# Patient Record
Sex: Male | Born: 1960 | Race: Black or African American | Hispanic: No | Marital: Married | Smoking: Never smoker
Health system: Southern US, Community
[De-identification: ages and names within clinical notes are randomized; demographics above are authoritative.]

## PROBLEM LIST (undated history)

## (undated) DIAGNOSIS — I10 Essential (primary) hypertension: Secondary | ICD-10-CM

## (undated) HISTORY — DX: Essential (primary) hypertension: I10

---

## 2009-04-12 ENCOUNTER — Ambulatory Visit: Payer: Self-pay | Admitting: Family Medicine

## 2009-04-12 DIAGNOSIS — I10 Essential (primary) hypertension: Secondary | ICD-10-CM

## 2009-05-04 ENCOUNTER — Ambulatory Visit: Payer: Self-pay | Admitting: Family Medicine

## 2009-05-07 LAB — CONVERTED CEMR LAB
AST: 24 units/L (ref 0–37)
Albumin: 4.4 g/dL (ref 3.5–5.2)
BUN: 16 mg/dL (ref 6–23)
Calcium: 9.6 mg/dL (ref 8.4–10.5)
Chloride: 106 meq/L (ref 96–112)
Creatinine, Ser: 1.12 mg/dL (ref 0.40–1.50)
Glucose, Bld: 106 mg/dL — ABNORMAL HIGH (ref 70–99)
Potassium: 4 meq/L (ref 3.5–5.3)

## 2009-05-18 ENCOUNTER — Ambulatory Visit: Payer: Self-pay | Admitting: Family Medicine

## 2009-06-07 ENCOUNTER — Ambulatory Visit: Payer: Self-pay | Admitting: Family Medicine

## 2009-07-03 ENCOUNTER — Ambulatory Visit: Payer: Self-pay | Admitting: Family Medicine

## 2011-03-04 ENCOUNTER — Other Ambulatory Visit: Payer: Self-pay | Admitting: Family Medicine

## 2011-03-04 MED ORDER — CARVEDILOL 25 MG PO TABS: 25.0000 mg | ORAL_TABLET | Freq: Two times a day (BID) | ORAL | Status: DC

## 2011-03-04 MED ORDER — OLMESARTAN-AMLODIPINE-HCTZ 40-10-25 MG PO TABS: ORAL_TABLET | ORAL | Status: DC

## 2011-03-04 NOTE — Telephone Encounter (Signed)
Refill of BP med.  Out X 2 weeks.  Refilled carvedilol and tribenzor. Pt was scheduled an office visit for his BP as well for next week.  Pt informed that he needs to keep this appt before any more refills will be sent. Jarvis Newcomer, LPN [facility]

## 2011-03-08 ENCOUNTER — Encounter: Payer: Self-pay | Admitting: Family Medicine

## 2011-03-13 ENCOUNTER — Ambulatory Visit (INDEPENDENT_AMBULATORY_CARE_PROVIDER_SITE_OTHER): Payer: Commercial Indemnity | Admitting: Family Medicine

## 2011-03-13 ENCOUNTER — Encounter: Payer: Self-pay | Admitting: Family Medicine

## 2011-03-13 VITALS — BP 208/121 | HR 78 | Ht 72.0 in | Wt 251.0 lb

## 2011-03-13 DIAGNOSIS — I10 Essential (primary) hypertension: Secondary | ICD-10-CM

## 2011-03-13 MED ORDER — TRAMADOL HCL 50 MG PO TABS: 50.0000 mg | ORAL_TABLET | Freq: Three times a day (TID) | ORAL | Status: DC | PRN

## 2011-03-13 MED ORDER — OLMESARTAN-AMLODIPINE-HCTZ 40-10-25 MG PO TABS: ORAL_TABLET | ORAL | Status: DC

## 2011-03-13 NOTE — Assessment & Plan Note (Signed)
His blood pressure is clearly out of control since he is off of his medications. I gave him a month's worth of samples of Tribenzor were given to restart. He is to followup in one month to see how his blood pressure is doing. He will likely need an additional agent but I will drop him too fast to make him feel dizzy or lightheaded. He is completely asymptomatic today with no chest pain shortness of breath vision changes or dizziness. I suspect he's been running at a very elevated level for quite some time. We will get lab work today since he is fasting lipids CMP, lipid panel, TSH. He says he will follow up in one month.

## 2011-03-13 NOTE — Progress Notes (Signed)
  Subjective:    Patient ID: Philip Shelton, male    DOB: [birth year], 50 y.o.   MRN: [id number]  HPI  Off his BP meds for almost a year. Work BP have been running in the 160. Still on the carvedilol. No HA or CP, or SOB.  No vision changes.  Tribenzor, Med was not refilled for lack of followup.   Left shoulder pain for 2 weeks after wresting with his kids. He points to the top of his shoulder. Now painful to reach up. No weakness, painful to sleep on that shoulder. No throbing just a "pain".  Bothersome with activities. Better at rest. Not taking nay meds for it. No prior injuries to his shoulder. No weakness numbness or tingling in the hands.  Review of Systems     Objective:   Physical Exam  Constitutional: He is oriented to person, place, and time. He appears well-developed and well-nourished.  HENT:  Head: Normocephalic and atraumatic.  Cardiovascular: Normal rate, regular rhythm and normal heart sounds.   Pulmonary/Chest: Effort normal and breath sounds normal.  Musculoskeletal: He exhibits no edema.       Left shoulder: Unable to full extend, slightly dec internal rotaiton.  Strength 5/5 but + empty can test on the left.  Nontender with palpation. N oredness or swelling.   Neurological: He is alert and oriented to person, place, and time.  Skin: Skin is warm and dry.  Psychiatric: He has a normal mood and affect.          Assessment & Plan:  Left shoulder pain-based on his exam today do think he has a rotator cuff tear. I gave him a handout on this particular condition and recommend he start the exercises. I also recommend warm moist heat to the joint. Normally a recommend anti-inflammatory that his blood pressure is out of control today that I would recommend he start with Tylenol. I did give him a prescription for tramadol as well for when necessary pain use. Followup in one month to make sure he is improving. If not improving, we will consider putting him in formal physical  therapy at that point.

## 2011-03-13 NOTE — Patient Instructions (Signed)
Can also use Tylenol  (2 extra strengths)  3 x a day for your shoulder in addition to the tramadol.

## 2011-03-14 LAB — TSH: TSH: 1.462 u[IU]/mL (ref 0.350–4.500)

## 2011-03-14 LAB — LIPID PANEL
LDL Cholesterol: 77 mg/dL (ref 0–99)
Triglycerides: 91 mg/dL (ref <150)
VLDL: 18 mg/dL (ref 0–40)

## 2011-03-15 LAB — COMPLETE METABOLIC PANEL WITH GFR
ALT: 32 U/L (ref 0–53)
AST: 27 U/L (ref 0–37)
Albumin: 4.3 g/dL (ref 3.5–5.2)
CO2: 27 mEq/L (ref 19–32)
Calcium: 9.2 mg/dL (ref 8.4–10.5)
Chloride: 104 mEq/L (ref 96–112)
GFR, Est African American: >60 mL/min (ref >60)
Potassium: 4.1 mEq/L (ref 3.5–5.3)
Sodium: 140 mEq/L (ref 135–145)
Total Protein: 7.5 g/dL (ref 6.0–8.3)

## 2011-03-16 ENCOUNTER — Telehealth: Payer: Self-pay | Admitting: Family Medicine

## 2011-03-16 NOTE — Telephone Encounter (Signed)
Call ptL Thyroid is nl.  CMP is normal. Chol is OK excpet good chol is low. Recommend regular exercise to improve this.

## 2011-03-17 NOTE — Telephone Encounter (Signed)
LMOM advising pt of results and rec. 

## 2011-04-09 ENCOUNTER — Other Ambulatory Visit: Payer: Self-pay | Admitting: Family Medicine

## 2011-04-21 ENCOUNTER — Ambulatory Visit (INDEPENDENT_AMBULATORY_CARE_PROVIDER_SITE_OTHER): Payer: Commercial Indemnity | Admitting: Family Medicine

## 2011-04-21 ENCOUNTER — Encounter: Payer: Self-pay | Admitting: Family Medicine

## 2011-04-21 VITALS — BP 119/75 | HR 67 | Ht 72.0 in | Wt 244.0 lb

## 2011-04-21 DIAGNOSIS — I10 Essential (primary) hypertension: Secondary | ICD-10-CM

## 2011-04-21 MED ORDER — IBUPROFEN 800 MG PO TABS: 800.0000 mg | ORAL_TABLET | Freq: Three times a day (TID) | ORAL | Status: AC | PRN

## 2011-04-21 NOTE — Progress Notes (Signed)
  Subjective:    Patient ID: Philip Shelton, male    DOB: [birth year], 50 y.o.   MRN: [id number]  Hypertension This is a chronic problem. The current episode started more than 1 year ago. The problem is controlled. There are no associated agents to hypertension. Risk factors for coronary artery disease include no known risk factors. Past treatments include ACE inhibitors, lifestyle changes, calcium channel blockers and beta blockers. The current treatment provides significant improvement. There are no compliance problems.       Review of Systems     Objective:   Physical Exam  Constitutional: He is oriented to person, place, and time. He appears well-developed and well-nourished.  HENT:  Head: Normocephalic and atraumatic.  Cardiovascular: Normal rate, regular rhythm and normal heart sounds.   Pulmonary/Chest: Effort normal and breath sounds normal.  Musculoskeletal: He exhibits no edema.  Neurological: He is alert and oriented to person, place, and time.  Skin: Skin is warm and dry.  Psychiatric: He has a normal mood and affect.          Assessment & Plan:

## 2011-04-21 NOTE — Assessment & Plan Note (Signed)
He is ding well. Has lost 7 lbs, eating more healthy diet, low salt, getting exercise. BP well controlled for the first time in year. Keep up the goodwork.  F/U in 6 months.

## 2011-05-14 ENCOUNTER — Other Ambulatory Visit: Payer: Self-pay | Admitting: Family Medicine

## 2011-06-16 ENCOUNTER — Other Ambulatory Visit: Payer: Self-pay | Admitting: Family Medicine

## 2011-09-24 ENCOUNTER — Other Ambulatory Visit: Payer: Self-pay | Admitting: Family Medicine

## 2011-11-27 ENCOUNTER — Other Ambulatory Visit: Payer: Self-pay | Admitting: Family Medicine

## 2011-11-28 NOTE — Telephone Encounter (Signed)
Needs appointment

## 2012-01-09 ENCOUNTER — Other Ambulatory Visit: Payer: Self-pay | Admitting: Family Medicine

## 2012-01-09 NOTE — Telephone Encounter (Signed)
Must make appoinment before any further refills

## 2012-03-05 ENCOUNTER — Other Ambulatory Visit: Payer: Self-pay | Admitting: Family Medicine

## 2012-03-14 ENCOUNTER — Other Ambulatory Visit: Payer: Self-pay | Admitting: Family Medicine

## 2012-05-26 ENCOUNTER — Encounter: Payer: Self-pay | Admitting: Family Medicine

## 2012-05-26 ENCOUNTER — Ambulatory Visit (INDEPENDENT_AMBULATORY_CARE_PROVIDER_SITE_OTHER): Payer: BC Managed Care – PPO | Admitting: Family Medicine

## 2012-05-26 VITALS — BP 178/109 | HR 75 | Wt 249.0 lb

## 2012-05-26 DIAGNOSIS — Z1211 Encounter for screening for malignant neoplasm of colon: Secondary | ICD-10-CM

## 2012-05-26 DIAGNOSIS — D239 Other benign neoplasm of skin, unspecified: Secondary | ICD-10-CM

## 2012-05-26 DIAGNOSIS — I10 Essential (primary) hypertension: Secondary | ICD-10-CM

## 2012-05-26 LAB — LIPID PANEL
Cholesterol: 129 mg/dL (ref 0–200)
HDL: 38 mg/dL — ABNORMAL LOW (ref >39)
LDL Cholesterol: 74 mg/dL (ref 0–99)
Triglycerides: 86 mg/dL (ref <150)

## 2012-05-26 LAB — COMPLETE METABOLIC PANEL WITH GFR
ALT: 30 U/L (ref 0–53)
AST: 21 U/L (ref 0–37)
CO2: 26 mEq/L (ref 19–32)
Creat: 1 mg/dL (ref 0.50–1.35)
GFR, Est African American: >89 mL/min
Total Bilirubin: 0.4 mg/dL (ref 0.3–1.2)

## 2012-05-26 MED ORDER — CARVEDILOL 25 MG PO TABS: 25.0000 mg | ORAL_TABLET | Freq: Two times a day (BID) | ORAL | Status: DC

## 2012-05-26 MED ORDER — OLMESARTAN-AMLODIPINE-HCTZ 40-10-25 MG PO TABS: 1.0000 | ORAL_TABLET | Freq: Every day | ORAL | Status: DC

## 2012-05-26 NOTE — Progress Notes (Signed)
  Subjective:    Patient ID: Philip Shelton, male    DOB: [birth year], 51 y.o.   MRN: [id number]  HPI HTN- No CP or SOB or HA or dizziness.  Tooks meds this AM.  No NSAIDs or decongestant. He denies eating a high salt diet. He has gained 5 pounds since I saw him a year ago. He denies any lower Schimke swelling or edema.  Has lesion on right upper arm. Noticed i tmonths ago. Not bothersome. Has actually been getting smaller.    Review of Systems     Objective:   Physical Exam  Constitutional: He is oriented to person, place, and time. He appears well-developed and well-nourished.  HENT:  Head: Normocephalic and atraumatic.  Cardiovascular: Normal rate, regular rhythm and normal heart sounds.   Pulmonary/Chest: Effort normal and breath sounds normal.  Neurological: He is alert and oriented to person, place, and time.  Skin: Skin is warm and dry.  Psychiatric: He has a normal mood and affect. His behavior is normal.    Small dermatofibroma on right upper arm.      Assessment & Plan:  HTN - uncontrolled today. He says he did take his medications at 9:15 this morning. [place] Spaniel that was about on half ago. He's and symptomatic today. He is due for CMP and fasting lipid panel. He'll have the lab and come back and have his blood pressure rechecked. I'm not sure why his blood pressure looks fantastic a year ago. The only difference as he is about 5 pounds heavier. Discussed working on low salt diet regular exercise and weight loss. He is Re: on the max dose of Tribenzor and the max dose of carvedilol. If he is still not well controlled consider adding spironolactone versus clonidine patch.  Dermatofibroma - Gave reassurance.   Need for colon Cancer screening. Ok for referral.   Flu vaccine declined.

## 2012-05-27 ENCOUNTER — Telehealth: Payer: Self-pay | Admitting: *Deleted

## 2012-05-27 NOTE — Telephone Encounter (Signed)
Message copied by Florestine Avers on Thu May 27, 2012 11:37 AM ------      Message from: Nani Gasser D      Created: Wed May 26, 2012  9:23 PM       Call pt: CMP is normal.  Thyroid is nl. Lipid is normal, except HDL, good chol, is low. Work on exercise adn diet.

## 2012-05-27 NOTE — Telephone Encounter (Signed)
Patient notified of labs and diet/exercise We discussed low salt diet, he states he eats 2 bologna sandwiches a day. Talked about Lunch meats have lots of sodium

## 2012-05-27 NOTE — Telephone Encounter (Signed)
Left message to call office about labs

## 2012-05-31 ENCOUNTER — Telehealth: Payer: Self-pay | Admitting: *Deleted

## 2012-05-31 ENCOUNTER — Ambulatory Visit (INDEPENDENT_AMBULATORY_CARE_PROVIDER_SITE_OTHER): Payer: BC Managed Care – PPO | Admitting: Family Medicine

## 2012-05-31 VITALS — BP 135/88 | HR 68

## 2012-05-31 DIAGNOSIS — I10 Essential (primary) hypertension: Secondary | ICD-10-CM

## 2012-05-31 NOTE — Progress Notes (Addendum)
  Subjective:    Patient ID: Philip Shelton, male    DOB: [birth year], 51 y.o.   MRN: [id number]  HPI    Pt denies chest pain, SOB, dizziness, or heart palpitations.  Taking meds as directed w/o problems.  Denies medication side effects.  5 min spent with pt.  Review of Systems     Objective:   Physical Exam        Assessment & Plan:  HTN - well controlled today. Continue current regimen. Followup in 4-6 months. Nani Gasser, MD

## 2012-05-31 NOTE — Telephone Encounter (Signed)
Left message on vm with dr. Instructions re nurse visit

## 2012-10-08 ENCOUNTER — Encounter: Payer: Self-pay | Admitting: Family Medicine

## 2013-01-11 ENCOUNTER — Other Ambulatory Visit: Payer: Self-pay | Admitting: Family Medicine

## 2013-01-18 ENCOUNTER — Other Ambulatory Visit: Payer: Self-pay | Admitting: *Deleted

## 2013-01-18 NOTE — Telephone Encounter (Signed)
Pt was to have f/u in March did not. Will need to make an appt.Philip Shelton [place]

## 2013-03-15 ENCOUNTER — Encounter: Payer: Self-pay | Admitting: Family Medicine

## 2013-03-15 ENCOUNTER — Ambulatory Visit (INDEPENDENT_AMBULATORY_CARE_PROVIDER_SITE_OTHER): Payer: BC Managed Care – PPO | Admitting: Family Medicine

## 2013-03-15 VITALS — BP 150/100 | HR 92 | Ht 72.0 in | Wt 259.0 lb

## 2013-03-15 DIAGNOSIS — R590 Localized enlarged lymph nodes: Secondary | ICD-10-CM

## 2013-03-15 DIAGNOSIS — I10 Essential (primary) hypertension: Secondary | ICD-10-CM

## 2013-03-15 DIAGNOSIS — R599 Enlarged lymph nodes, unspecified: Secondary | ICD-10-CM

## 2013-03-15 DIAGNOSIS — R7301 Impaired fasting glucose: Secondary | ICD-10-CM | POA: Insufficient documentation

## 2013-03-15 MED ORDER — OLMESARTAN-AMLODIPINE-HCTZ 40-10-25 MG PO TABS: 1.0000 | ORAL_TABLET | Freq: Every day | ORAL | Status: DC

## 2013-03-15 MED ORDER — CARVEDILOL 25 MG PO TABS: 25.0000 mg | ORAL_TABLET | Freq: Two times a day (BID) | ORAL | Status: DC

## 2013-03-15 NOTE — Progress Notes (Signed)
  Subjective:    Patient ID: Philip Shelton, male    DOB: December 09, 1960, 52 y.o.   MRN: [id number]  HPI HTN - Hasn't been here in quite some time.   Pt denies chest pain, SOB, dizziness, or heart palpitations.  Taking meds as directed w/o problems.  Denies medication side effects.  Has not been following a low salt diet. Says has been taking meds regularly. Has gained about 1010 lbs over the weinter but plans on losing about 15 lbs.  No swelling.   Noticed swollen know under right chin about 3 days ago . No fever, ST or URI. Non tender. No skin changes or rash.. No trauma.  Review of Systems     Objective:   Physical Exam  Constitutional: He is oriented to person, place, and time. He appears well-developed and well-nourished.  HENT:  Head: Normocephalic and atraumatic.  Cardiovascular: Normal rate, regular rhythm and normal heart sounds.   Pulmonary/Chest: Effort normal and breath sounds normal.  Neurological: He is alert and oriented to person, place, and time.  Skin: Skin is warm and dry.  Psychiatric: He has a normal mood and affect. His behavior is normal.          Assessment & Plan:  HTN - Uncontrolled. Reminded him about the DASH diet.  Work on diet, exercise and weight loss.  Check BMP today. F/U in 1 month to recheck. If still not well controlled will add hydralazine for better control.   Abnormal glucose-we did in 11 A1c since his glucose was elevated when he had his lab work last September. His hemoglobin A1c was 5.7 which does put him in the impaired fasting glucose range. We discussed this diagnosis. He is to cut back on concentrated sweets including sweetened beverages as well as looking at his portion sizes on his carbohydrates and cutting back on this. Significant weight loss will make a huge difference as well. We'll repeat his hemoglobin A1c in 6 months.  Anterior cervical lymphadenopathy under the chin. We will check a CBC today. He has only noticed it for a couple of  days. See if it resolves over the next month. Could be from a nick with shaving since he has not had any cold, sore throat or respiratory symptoms.

## 2013-03-15 NOTE — Patient Instructions (Signed)
DASH Diet The DASH diet stands for "Dietary Approaches to Stop Hypertension." It is a healthy eating plan that has been shown to reduce high blood pressure (hypertension) in as little as 14 days, while also possibly providing other significant health benefits. These other health benefits include reducing the risk of breast cancer after menopause and reducing the risk of type 2 diabetes, heart disease, colon cancer, and stroke. Health benefits also include weight loss and slowing kidney failure in patients with chronic kidney disease.  DIET GUIDELINES  Limit salt (sodium). Your diet should contain less than 1500 mg of sodium daily.  Limit refined or processed carbohydrates. Your diet should include mostly whole grains. Desserts and added sugars should be used sparingly.  Include small amounts of heart-healthy fats. These types of fats include nuts, oils, and tub margarine. Limit saturated and trans fats. These fats have been shown to be harmful in the body. CHOOSING FOODS  The following food groups are based on a 2000 calorie diet. See your Registered Dietitian for individual calorie needs. Grains and Grain Products (6 to 8 servings daily)  Eat More Often: Whole-wheat bread, brown rice, whole-grain or wheat pasta, quinoa, popcorn without added fat or salt (air popped).  Eat Less Often: White bread, white pasta, white rice, cornbread. Vegetables (4 to 5 servings daily)  Eat More Often: Fresh, frozen, and canned vegetables. Vegetables may be raw, steamed, roasted, or grilled with a minimal amount of fat.  Eat Less Often/Avoid: Creamed or fried vegetables. Vegetables in a cheese sauce. Fruit (4 to 5 servings daily)  Eat More Often: All fresh, canned (in natural juice), or frozen fruits. Dried fruits without added sugar. One hundred percent fruit juice ( cup [237 mL] daily).  Eat Less Often: Dried fruits with added sugar. Canned fruit in light or heavy syrup. Lean Meats, Fish, and Poultry (2  servings or less daily. One serving is 3 to 4 oz [85-114 g]).  Eat More Often: Ninety percent or leaner ground beef, tenderloin, sirloin. Round cuts of beef, chicken breast, turkey breast. All fish. Grill, bake, or broil your meat. Nothing should be fried.  Eat Less Often/Avoid: Fatty cuts of meat, turkey, or chicken leg, thigh, or wing. Fried cuts of meat or fish. Dairy (2 to 3 servings)  Eat More Often: Low-fat or fat-free milk, low-fat plain or light yogurt, reduced-fat or part-skim cheese.  Eat Less Often/Avoid: Milk (whole, 2%).Whole milk yogurt. Full-fat cheeses. Nuts, Seeds, and Legumes (4 to 5 servings per week)  Eat More Often: All without added salt.  Eat Less Often/Avoid: Salted nuts and seeds, canned beans with added salt. Fats and Sweets (limited)  Eat More Often: Vegetable oils, tub margarines without trans fats, sugar-free gelatin. Mayonnaise and salad dressings.  Eat Less Often/Avoid: Coconut oils, palm oils, butter, stick margarine, cream, half and half, cookies, candy, pie. FOR MORE INFORMATION The Dash Diet Eating Plan: www.dashdiet.org Document Released: 08/14/2011 Document Revised: 11/17/2011 Document Reviewed: 08/14/2011 Exit[employer] Patient Information 2014 [employer]. 2 Gram Low Sodium Diet A 2 gram sodium diet restricts the amount of sodium in the diet to no more than 2 g or 2000 mg daily. Limiting the amount of sodium is often used to help lower blood pressure. It is important if you have heart, liver, or kidney problems. Many foods contain sodium for flavor and sometimes as a preservative. When the amount of sodium in a diet needs to be low, it is important to know what to look for when choosing foods and   drinks. The following includes some information and guidelines to help make it easier for you to adapt to a low sodium diet. QUICK TIPS  Do not add salt to food.  Avoid convenience items and fast food.  Choose unsalted snack foods.  Buy lower sodium  products, often labeled as "lower sodium" or "no salt added."  Check food labels to learn how much sodium is in 1 serving.  When eating at a restaurant, ask that your food be prepared with less salt or none, if possible. READING FOOD LABELS FOR SODIUM INFORMATION The nutrition facts label is a good place to find how much sodium is in foods. Look for products with no more than 500 to 600 mg of sodium per meal and no more than 150 mg per serving. Remember that 2 g = 2000 mg. The food label may also list foods as:  Sodium-free: Less than 5 mg in a serving.  Very low sodium: 35 mg or less in a serving.  Low-sodium: 140 mg or less in a serving.  Light in sodium: 50% less sodium in a serving. For example, if a food that usually has 300 mg of sodium is changed to become light in sodium, it will have 150 mg of sodium.  Reduced sodium: 25% less sodium in a serving. For example, if a food that usually has 400 mg of sodium is changed to reduced sodium, it will have 300 mg of sodium. CHOOSING FOODS Grains  Avoid: Salted crackers and snack items. Some cereals, including instant hot cereals. Bread stuffing and biscuit mixes. Seasoned rice or pasta mixes.  Choose: Unsalted snack items. Low-sodium cereals, oats, puffed wheat and rice, shredded wheat. English muffins and bread. Pasta. Meats  Avoid: Salted, canned, smoked, spiced, pickled meats, including fish and poultry. Bacon, ham, sausage, cold cuts, hot dogs, anchovies.  Choose: Low-sodium canned tuna and salmon. Fresh or frozen meat, poultry, and fish. Dairy  Avoid: Processed cheese and spreads. Cottage cheese. Buttermilk and condensed milk. Regular cheese.  Choose: Milk. Low-sodium cottage cheese. Yogurt. Sour cream. Low-sodium cheese. Fruits and Vegetables  Avoid: Regular canned vegetables. Regular canned tomato sauce and paste. Frozen vegetables in sauces. Olives. Pickles. Relishes. Sauerkraut.  Choose: Low-sodium canned vegetables.  Low-sodium tomato sauce and paste. Frozen or fresh vegetables. Fresh and frozen fruit. Condiments  Avoid: Canned and packaged gravies. Worcestershire sauce. Tartar sauce. Barbecue sauce. Soy sauce. Steak sauce. Ketchup. Onion, garlic, and table salt. Meat flavorings and tenderizers.  Choose: Fresh and dried herbs and spices. Low-sodium varieties of mustard and ketchup. Lemon juice. Tabasco sauce. Horseradish. SAMPLE 2 GRAM SODIUM MEAL PLAN Breakfast / Sodium (mg)  1 cup low-fat milk / 143 mg  2 slices whole-wheat toast / 270 mg  1 tbs heart-healthy margarine / 153 mg  1 hard-boiled egg / 139 mg  1 small orange / 0 mg Lunch / Sodium (mg)  1 cup raw carrots / 76 mg   cup hummus / 298 mg  1 cup low-fat milk / 143 mg   cup red grapes / 2 mg  1 whole-wheat pita bread / 356 mg Dinner / Sodium (mg)  1 cup whole-wheat pasta / 2 mg  1 cup low-sodium tomato sauce / 73 mg  3 oz lean ground beef / 57 mg  1 small side salad (1 cup raw spinach leaves,  cup cucumber,  cup yellow bell pepper) with 1 tsp olive oil and 1 tsp red wine vinegar / 25 mg Snack / Sodium (mg)  1   container low-fat vanilla yogurt / 107 mg  3 graham cracker squares / 127 mg Nutrient Analysis  Calories: 2033  Protein: 77 g  Carbohydrate: 282 g  Fat: 72 g  Sodium: 1971 mg Document Released: 08/25/2005 Document Revised: 11/17/2011 Document Reviewed: 11/26/2009 Exit[employer] Patient Information 2014 [employer].  

## 2013-03-16 LAB — CBC WITH DIFFERENTIAL/PLATELET
Basophils Absolute: 0 10*3/uL (ref 0.0–0.1)
Basophils Relative: 0 % (ref 0–1)
Eosinophils Absolute: 0.1 10*3/uL (ref 0.0–0.7)
MCH: 29.2 pg (ref 26.0–34.0)
MCHC: 34 g/dL (ref 30.0–36.0)
Neutro Abs: 3.2 10*3/uL (ref 1.7–7.7)
Neutrophils Relative %: 55 % (ref 43–77)
Platelets: 171 10*3/uL (ref 150–400)
RDW: 15.5 % (ref 11.5–15.5)

## 2013-03-16 LAB — BASIC METABOLIC PANEL WITH GFR
BUN: 9 mg/dL (ref 6–23)
Calcium: 9.2 mg/dL (ref 8.4–10.5)
GFR, Est African American: >89 mL/min
GFR, Est Non African American: 81 mL/min
Glucose, Bld: 110 mg/dL — ABNORMAL HIGH (ref 70–99)

## 2013-04-19 ENCOUNTER — Encounter: Payer: Self-pay | Admitting: Family Medicine

## 2013-04-19 ENCOUNTER — Ambulatory Visit (INDEPENDENT_AMBULATORY_CARE_PROVIDER_SITE_OTHER): Payer: BC Managed Care – PPO | Admitting: Family Medicine

## 2013-04-19 VITALS — BP 121/71 | HR 69 | Ht 72.0 in | Wt 253.0 lb

## 2013-04-19 DIAGNOSIS — Z6834 Body mass index (BMI) 34.0-34.9, adult: Secondary | ICD-10-CM

## 2013-04-19 DIAGNOSIS — E669 Obesity, unspecified: Secondary | ICD-10-CM

## 2013-04-19 DIAGNOSIS — I10 Essential (primary) hypertension: Secondary | ICD-10-CM

## 2013-04-19 NOTE — Progress Notes (Signed)
  Subjective:    Patient ID: Philip Shelton, male    DOB: [birth year], 52 y.o.   MRN: [id number]  HPI HTN -  Pt denies chest pain, SOB, dizziness, or heart palpitations.  Taking meds as directed w/o problems.  Denies medication side effects.  Has lost has 6 lbs.  Has been watching his diet.  Has been getting exercising coaching.      Review of Systems     Objective:   Physical Exam  Constitutional: He is oriented to person, place, and time. He appears well-developed and well-nourished.  HENT:  Head: Normocephalic and atraumatic.  Cardiovascular: Normal rate, regular rhythm and normal heart sounds.   Pulmonary/Chest: Effort normal and breath sounds normal.  Neurological: He is alert and oriented to person, place, and time.  Skin: Skin is warm and dry.  Psychiatric: He has a normal mood and affect. His behavior is normal.          Assessment & Plan:  HTN - Well contorlled. It looks fantastic today. It was quite elevated last time. Continue work on exercise, diet and low salt intake. Congratulated him on a 6 pound weight loss. F/U in 4 months.   Obesity, BMI of 34-he is actually doing fantastic. He's lost 6 more pounds. Continue work on diet and exercise. 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

## 2013-11-09 ENCOUNTER — Other Ambulatory Visit: Payer: Self-pay | Admitting: *Deleted

## 2013-11-09 MED ORDER — OLMESARTAN-AMLODIPINE-HCTZ 40-10-25 MG PO TABS: 1.0000 | ORAL_TABLET | Freq: Every day | ORAL | Status: DC

## 2013-11-16 ENCOUNTER — Encounter: Payer: Self-pay | Admitting: Family Medicine

## 2013-11-16 ENCOUNTER — Ambulatory Visit (INDEPENDENT_AMBULATORY_CARE_PROVIDER_SITE_OTHER): Payer: BC Managed Care – PPO | Admitting: Family Medicine

## 2013-11-16 VITALS — BP 130/86 | HR 76 | Temp 97.7°F | Ht 72.0 in | Wt 256.0 lb

## 2013-11-16 DIAGNOSIS — R0989 Other specified symptoms and signs involving the circulatory and respiratory systems: Secondary | ICD-10-CM

## 2013-11-16 DIAGNOSIS — R0609 Other forms of dyspnea: Secondary | ICD-10-CM

## 2013-11-16 DIAGNOSIS — I10 Essential (primary) hypertension: Secondary | ICD-10-CM

## 2013-11-16 DIAGNOSIS — R0683 Snoring: Secondary | ICD-10-CM

## 2013-11-16 DIAGNOSIS — R7301 Impaired fasting glucose: Secondary | ICD-10-CM

## 2013-11-16 LAB — POCT GLYCOSYLATED HEMOGLOBIN (HGB A1C): Hemoglobin A1C: 6

## 2013-11-16 NOTE — Progress Notes (Signed)
   Subjective:    Patient ID: Philip Shelton, male    DOB: 02-19-1961, 53 y.o.   MRN: [id number]  HPI Hypertension- Pt denies chest pain, SOB, dizziness, or heart palpitations.  Taking meds as directed w/o problems.  Denies medication side effects.    Impaired fasting glucose-no increased thirst or urination. Lab Results  Component Value Date   HGBA1C 5.7 03/15/2013   Snoring - Will do a STOP BANG today, he does snore when he lays on his side. He admits he is overweight. He typically loses more weight during football season. He has never been tested for sleep apnea before.  Review of Systems  BP 130/86  Pulse 76  Temp(Src) 97.7 F (36.5 C)  Ht 6' (1.829 m)  Wt 256 lb (116.121 kg)  BMI 34.71 kg/m2  SpO2 96%    No Known Allergies  Past Medical History  Diagnosis Date  . Hypertension     No past surgical history on file.  History   Social History  . Marital Status: Married    Spouse Name: N/A    Number of Children: N/A  . Years of Education: N/A   Occupational History  . Not on file.   Social History Main Topics  . Smoking status: Never Smoker   . Smokeless tobacco: Not on file  . Alcohol Use: No  . Drug Use: No  . 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   Other Topics Concern  . Not on file   Social History Narrative  . No narrative on file    Family History  Problem Relation Age of Onset  . Hypertension Mother   . Hypertension Father   . Breast cancer Mother     Outpatient Encounter Prescriptions as of 11/16/2013  Medication Sig  . carvedilol (COREG) 25 MG tablet Take 1 tablet (25 mg total) by mouth 2 (two) times daily with a meal.  . Olmesartan-Amlodipine-HCTZ (TRIBENZOR) 40-10-25 MG TABS Take 1 tablet by mouth daily.          Objective:   Physical Exam  Constitutional: He is oriented to person, place, and time. He  appears well-developed and well-nourished.  HENT:  Head: Normocephalic and atraumatic.  Cardiovascular: Normal rate, regular rhythm and normal heart sounds.   Pulmonary/Chest: Effort normal and breath sounds normal.  Neurological: He is alert and oriented to person, place, and time.  Skin: Skin is warm and dry.  Psychiatric: He has a normal mood and affect. His behavior is normal.          Assessment & Plan:  Hypertension-well-controlled. Continue current regimen. Followup in 6 months.  Impaired fasting glucose-implement A1c was 6.0.  Stable but up from last time. Continue work on diet and exercise. Repeat in 6 months.  Snoring - STOP BANG Score of 4 ( positive screen for high risk)  encouraged him to consider getting tested for sleep apnea. Him how this works. Also discussed potential complications of untreated sleep apnea including affecting blood pressure and his heart. He would like to think about it and get back with me on it.

## 2013-12-15 ENCOUNTER — Other Ambulatory Visit: Payer: Self-pay | Admitting: Family Medicine

## 2014-01-23 ENCOUNTER — Other Ambulatory Visit: Payer: Self-pay | Admitting: Family Medicine

## 2014-03-29 ENCOUNTER — Encounter: Payer: Self-pay | Admitting: Family Medicine

## 2014-03-29 ENCOUNTER — Ambulatory Visit (INDEPENDENT_AMBULATORY_CARE_PROVIDER_SITE_OTHER): Payer: BC Managed Care – PPO | Admitting: Family Medicine

## 2014-03-29 VITALS — BP 134/77 | HR 67 | Ht 72.0 in | Wt 234.0 lb

## 2014-03-29 DIAGNOSIS — I10 Essential (primary) hypertension: Secondary | ICD-10-CM

## 2014-03-29 DIAGNOSIS — R0683 Snoring: Secondary | ICD-10-CM

## 2014-03-29 DIAGNOSIS — R0609 Other forms of dyspnea: Secondary | ICD-10-CM

## 2014-03-29 DIAGNOSIS — R0989 Other specified symptoms and signs involving the circulatory and respiratory systems: Secondary | ICD-10-CM

## 2014-03-29 DIAGNOSIS — R7301 Impaired fasting glucose: Secondary | ICD-10-CM

## 2014-03-29 DIAGNOSIS — Z13220 Encounter for screening for lipoid disorders: Secondary | ICD-10-CM

## 2014-03-29 LAB — LIPID PANEL
CHOLESTEROL: 102 mg/dL (ref 0–200)
HDL: 39 mg/dL — ABNORMAL LOW (ref >39)
LDL Cholesterol: 51 mg/dL (ref 0–99)
Total CHOL/HDL Ratio: 2.6 Ratio
Triglycerides: 60 mg/dL (ref <150)
VLDL: 12 mg/dL (ref 0–40)

## 2014-03-29 LAB — COMPLETE METABOLIC PANEL WITH GFR
ALBUMIN: 3.9 g/dL (ref 3.5–5.2)
ALT: 22 U/L (ref 0–53)
AST: 16 U/L (ref 0–37)
Alkaline Phosphatase: 62 U/L (ref 39–117)
BUN: 13 mg/dL (ref 6–23)
CALCIUM: 8.7 mg/dL (ref 8.4–10.5)
CHLORIDE: 107 meq/L (ref 96–112)
CO2: 22 mEq/L (ref 19–32)
Creat: 0.98 mg/dL (ref 0.50–1.35)
GFR, EST NON AFRICAN AMERICAN: 88 mL/min
GFR, Est African American: >89 mL/min
GLUCOSE: 101 mg/dL — AB (ref 70–99)
POTASSIUM: 3.7 meq/L (ref 3.5–5.3)
SODIUM: 140 meq/L (ref 135–145)
TOTAL PROTEIN: 7.3 g/dL (ref 6.0–8.3)
Total Bilirubin: 0.5 mg/dL (ref 0.2–1.2)

## 2014-03-29 LAB — POCT GLYCOSYLATED HEMOGLOBIN (HGB A1C): HEMOGLOBIN A1C: 5.6

## 2014-03-29 MED ORDER — OLMESARTAN-AMLODIPINE-HCTZ 40-10-25 MG PO TABS: ORAL_TABLET | ORAL | Status: DC

## 2014-03-29 MED ORDER — CARVEDILOL 25 MG PO TABS: 25.0000 mg | ORAL_TABLET | Freq: Two times a day (BID) | ORAL | Status: DC

## 2014-03-29 NOTE — Progress Notes (Signed)
   Subjective:    Patient ID: Philip Shelton, male    DOB: 04/18/1961, 53 y.o.   MRN: [id number]  HPI Hypertension- Pt denies chest pain, SOB, dizziness, or heart palpitations.  Taking meds as directed w/o problems.  Denies medication side effects.  He has lost weight.  He has really been trying and wan't to get down to 220lbs. Has lost 22 lbs.   IFG - stable. Recheck in 1 year.  No increased thirst or urination.  We did discuss his snoring and possibly evaluating for sleep apnea when I last saw him. I was going to stop in on him today. He actually says his snoring has been better since he has lost the weight. Review of Systems     Objective:   Physical Exam  Constitutional: He is oriented to person, place, and time. He appears well-developed and well-nourished.  HENT:  Head: Normocephalic and atraumatic.  Cardiovascular: Normal rate, regular rhythm and normal heart sounds.   Pulmonary/Chest: Effort normal and breath sounds normal.  Neurological: He is alert and oriented to person, place, and time.  Skin: Skin is warm and dry.  Psychiatric: He has a normal mood and affect. His behavior is normal.          Assessment & Plan:  HTN- well controlled. Congratulated him on weight loss and diet changes. F/U in 6 months.    IFG - A1C looks great!  rehceck in 6-12 months.  Lab Results  Component Value Date   HGBA1C 5.6 03/29/2014    Snoring - snoring is better after 22 lb weight loss. Consider sleep study. Has lost 4 cm around his neck.  Continue to work to goal of 220lbs.

## 2015-04-24 ENCOUNTER — Encounter: Payer: Self-pay | Admitting: Family Medicine

## 2015-04-24 ENCOUNTER — Ambulatory Visit (INDEPENDENT_AMBULATORY_CARE_PROVIDER_SITE_OTHER): Payer: BC Managed Care – PPO | Admitting: Family Medicine

## 2015-04-24 VITALS — BP 135/85 | HR 64 | Wt 242.0 lb

## 2015-04-24 DIAGNOSIS — I10 Essential (primary) hypertension: Secondary | ICD-10-CM

## 2015-04-24 DIAGNOSIS — Z114 Encounter for screening for human immunodeficiency virus [HIV]: Secondary | ICD-10-CM

## 2015-04-24 DIAGNOSIS — M1711 Unilateral primary osteoarthritis, right knee: Secondary | ICD-10-CM | POA: Insufficient documentation

## 2015-04-24 DIAGNOSIS — Z1159 Encounter for screening for other viral diseases: Secondary | ICD-10-CM

## 2015-04-24 DIAGNOSIS — R7301 Impaired fasting glucose: Secondary | ICD-10-CM

## 2015-04-24 DIAGNOSIS — M25561 Pain in right knee: Secondary | ICD-10-CM

## 2015-04-24 DIAGNOSIS — M170 Bilateral primary osteoarthritis of knee: Secondary | ICD-10-CM | POA: Insufficient documentation

## 2015-04-24 MED ORDER — CARVEDILOL 25 MG PO TABS: 25.0000 mg | ORAL_TABLET | Freq: Two times a day (BID) | ORAL | Status: DC

## 2015-04-24 MED ORDER — OLMESARTAN-AMLODIPINE-HCTZ 40-10-25 MG PO TABS: ORAL_TABLET | ORAL | Status: DC

## 2015-04-24 MED ORDER — IBUPROFEN 800 MG PO TABS: 800.0000 mg | ORAL_TABLET | Freq: Three times a day (TID) | ORAL | Status: DC | PRN

## 2015-04-24 NOTE — Progress Notes (Signed)
   Subjective:    Patient ID: Philip Shelton, male    DOB: 07-Feb-1961, 54 y.o.   MRN: [id number]  HPI Hypertension- Pt denies chest pain, SOB, dizziness, or heart palpitations.  Taking meds as directed w/o problems.  Denies medication side effects.  He denies any swelling. Tolerating medication well without any side effects.  IFG - No inc thirst or urination.  He is up 8 lbs from last year.    Right knee pain - He would like  Rx of Ibuprofen, 800mg  strength.  He says his knee is injured form sports when he was younger. He says ti swells at time esp if he is more active.    Review of Systems     Objective:   Physical Exam  Constitutional: He is oriented to person, place, and time. He appears well-developed and well-nourished.  HENT:  Head: Normocephalic and atraumatic.  Cardiovascular: Normal rate, regular rhythm and normal heart sounds.   Pulmonary/Chest: Effort normal and breath sounds normal.  Neurological: He is alert and oriented to person, place, and time.  Skin: Skin is warm and dry.  Psychiatric: He has a normal mood and affect. His behavior is normal.          Assessment & Plan:  HTN- well controlled.  F/U in 6 months. Continue current regimen.    IFG - under fair control but up from previous.  Work on diet and exercise. His been actively trying to lose weight recently. Encouraged him to continue this and follow up in 6 months.  Right knee pain - offered to refer him to sports medicine but he declined this time. I will refill his prescription for IV pregnant 800 mg.

## 2016-03-03 ENCOUNTER — Other Ambulatory Visit: Payer: Self-pay

## 2016-03-03 MED ORDER — CARVEDILOL 25 MG PO TABS: 25.0000 mg | ORAL_TABLET | Freq: Two times a day (BID) | ORAL | Status: DC

## 2016-03-03 MED ORDER — OLMESARTAN-AMLODIPINE-HCTZ 40-10-25 MG PO TABS: ORAL_TABLET | ORAL | Status: DC

## 2016-03-10 ENCOUNTER — Ambulatory Visit (INDEPENDENT_AMBULATORY_CARE_PROVIDER_SITE_OTHER): Payer: BC Managed Care – PPO | Admitting: Family Medicine

## 2016-03-10 ENCOUNTER — Encounter: Payer: Self-pay | Admitting: Family Medicine

## 2016-03-10 VITALS — BP 136/88 | HR 79 | Wt 253.0 lb

## 2016-03-10 DIAGNOSIS — I10 Essential (primary) hypertension: Secondary | ICD-10-CM | POA: Diagnosis not present

## 2016-03-10 DIAGNOSIS — Z114 Encounter for screening for human immunodeficiency virus [HIV]: Secondary | ICD-10-CM

## 2016-03-10 DIAGNOSIS — Z1159 Encounter for screening for other viral diseases: Secondary | ICD-10-CM

## 2016-03-10 DIAGNOSIS — R7301 Impaired fasting glucose: Secondary | ICD-10-CM

## 2016-03-10 LAB — COMPLETE METABOLIC PANEL WITH GFR
ALBUMIN: 4.1 g/dL (ref 3.6–5.1)
ALK PHOS: 69 U/L (ref 40–115)
ALT: 32 U/L (ref 9–46)
AST: 25 U/L (ref 10–35)
BUN: 17 mg/dL (ref 7–25)
CO2: 25 mmol/L (ref 20–31)
Calcium: 8.9 mg/dL (ref 8.6–10.3)
Chloride: 105 mmol/L (ref 98–110)
Creat: 0.97 mg/dL (ref 0.70–1.33)
GFR, EST NON AFRICAN AMERICAN: 88 mL/min (ref >=60)
GFR, Est African American: >89 mL/min (ref >=60)
GLUCOSE: 92 mg/dL (ref 65–99)
POTASSIUM: 4.1 mmol/L (ref 3.5–5.3)
SODIUM: 142 mmol/L (ref 135–146)
TOTAL PROTEIN: 7.6 g/dL (ref 6.1–8.1)
Total Bilirubin: 0.4 mg/dL (ref 0.2–1.2)

## 2016-03-10 LAB — LIPID PANEL
CHOL/HDL RATIO: 3.2 ratio (ref <=5.0)
Cholesterol: 125 mg/dL (ref 125–200)
HDL: 39 mg/dL — AB (ref >=40)
LDL CALC: 69 mg/dL (ref <130)
Triglycerides: 83 mg/dL (ref <150)
VLDL: 17 mg/dL (ref <30)

## 2016-03-10 LAB — POCT GLYCOSYLATED HEMOGLOBIN (HGB A1C): HEMOGLOBIN A1C: 5.9

## 2016-03-10 MED ORDER — CARVEDILOL 25 MG PO TABS: 25.0000 mg | ORAL_TABLET | Freq: Two times a day (BID) | ORAL | Status: DC

## 2016-03-10 MED ORDER — OLMESARTAN-AMLODIPINE-HCTZ 40-10-25 MG PO TABS: ORAL_TABLET | ORAL | Status: DC

## 2016-03-10 MED ORDER — IBUPROFEN 800 MG PO TABS: 800.0000 mg | ORAL_TABLET | Freq: Three times a day (TID) | ORAL | Status: DC | PRN

## 2016-03-10 NOTE — Progress Notes (Signed)
Subjective:    CC: HTN, IFG  HPI:  IFG - No inc thrist or urination.   Hypertension- Pt denies chest pain, SOB, dizziness, or heart palpitations.  Taking meds as directed w/o problems.  Denies medication side effects.     Past medical history, Surgical history, Family history not pertinant except as noted below, Social history, Allergies, and medications have been entered into the medical record, reviewed, and corrections made.   Review of Systems: No fevers, chills, night sweats, weight loss, chest pain, or shortness of breath.   Objective:    General: Well Developed, well nourished, and in no acute distress.  Neuro: Alert and oriented x3, extra-ocular muscles intact, sensation grossly intact.  HEENT: Normocephalic, atraumatic, no TM.  Skin: Warm and dry, no rashes. Cardiac: Regular rate and rhythm, no murmurs rubs or gallops, no lower extremity edema. No carotid bruits.   Respiratory: Clear to auscultation bilaterally. Not using accessory muscles, speaking in full sentences.   Impression and Recommendations:   HTN - well controlled.  Given coupon card for [employer]. Evidently his previous cardiac expired in the price went up. Reminded importance of checking renal function Twice yearly.   IFG - A1c up a little bit from previous. Now 5.9. Continue work on diet and exercise and monitor carefully. Repeat in 6 months.

## 2016-03-11 LAB — HIV ANTIBODY (ROUTINE TESTING W REFLEX): HIV: NONREACTIVE

## 2016-03-11 LAB — HEPATITIS C ANTIBODY: HCV AB: NEGATIVE

## 2017-04-10 ENCOUNTER — Other Ambulatory Visit: Payer: Self-pay | Admitting: *Deleted

## 2017-04-10 DIAGNOSIS — I10 Essential (primary) hypertension: Secondary | ICD-10-CM

## 2017-04-10 MED ORDER — CARVEDILOL 25 MG PO TABS: 25.0000 mg | ORAL_TABLET | Freq: Two times a day (BID) | ORAL | 0 refills | Status: DC

## 2017-04-10 MED ORDER — OLMESARTAN-AMLODIPINE-HCTZ 40-10-25 MG PO TABS: ORAL_TABLET | ORAL | 0 refills | Status: DC

## 2017-04-21 ENCOUNTER — Encounter: Payer: Self-pay | Admitting: Family Medicine

## 2017-04-21 ENCOUNTER — Ambulatory Visit (INDEPENDENT_AMBULATORY_CARE_PROVIDER_SITE_OTHER): Payer: BC Managed Care – PPO | Admitting: Family Medicine

## 2017-04-21 VITALS — BP 132/82 | HR 64 | Wt 264.0 lb

## 2017-04-21 DIAGNOSIS — Z125 Encounter for screening for malignant neoplasm of prostate: Secondary | ICD-10-CM | POA: Diagnosis not present

## 2017-04-21 DIAGNOSIS — R7301 Impaired fasting glucose: Secondary | ICD-10-CM | POA: Diagnosis not present

## 2017-04-21 DIAGNOSIS — Z0000 Encounter for general adult medical examination without abnormal findings: Secondary | ICD-10-CM | POA: Diagnosis not present

## 2017-04-21 DIAGNOSIS — I10 Essential (primary) hypertension: Secondary | ICD-10-CM | POA: Diagnosis not present

## 2017-04-21 LAB — POCT GLYCOSYLATED HEMOGLOBIN (HGB A1C): Hemoglobin A1C: 6.2

## 2017-04-21 MED ORDER — CARVEDILOL 25 MG PO TABS: 25.0000 mg | ORAL_TABLET | Freq: Two times a day (BID) | ORAL | 3 refills | Status: DC

## 2017-04-21 MED ORDER — OLMESARTAN-AMLODIPINE-HCTZ 40-10-25 MG PO TABS: ORAL_TABLET | ORAL | 1 refills | Status: DC

## 2017-04-21 NOTE — Progress Notes (Signed)
Subjective:    CC: CPE  HPI: Hypertension- Pt denies chest pain, SOB, dizziness, or heart palpitations.  Taking meds as directed w/o problems.  Denies medication side effects.    Impaired fasting glucose-no increased thirst or urination. No symptoms consistent with hypoglycemia.    BP 132/82   Pulse 64   Wt 264 lb (119.7 kg)   SpO2 99%   BMI 35.80 kg/m     No Known Allergies  Past Medical History:  Diagnosis Date  . Hypertension     No past surgical history on file.  Social History   Social History  . Marital status: Married    Spouse name: N/A  . Number of children: N/A  . Years of education: N/A   Occupational History  . Not on file.   Social History Main Topics  . Smoking status: Never Smoker  . Smokeless tobacco: Never Used  . Alcohol use No  . Drug use: No  . Sexual activity: Not on file     Comment: correction sargent @ [employer], married, 2 kids, regularly exercises, walks, weights, runs daily, 1-2 cups caffeine daily.   Other Topics Concern  . Not on file   Social History Narrative  . No narrative on file    Family History  Problem Relation Age of Onset  . Hypertension Mother   . Hypertension Father   . Breast cancer Mother     Outpatient Encounter Prescriptions as of 04/21/2017  Medication Sig  . carvedilol (COREG) 25 MG tablet Take 1 tablet (25 mg total) by mouth 2 (two) times daily with a meal.  . Olmesartan-Amlodipine-HCTZ (TRIBENZOR) 40-10-25 MG TABS take 1 tablet by mouth once daily  . [DISCONTINUED] ibuprofen (ADVIL,MOTRIN) 800 MG tablet Take 1 tablet (800 mg total) by mouth every 8 (eight) hours as needed.  . [DISCONTINUED] carvedilol (COREG) 25 MG tablet Take 1 tablet (25 mg total) by mouth 2 (two) times daily with a meal. keep f/u appt 8/14  . [DISCONTINUED] Olmesartan-Amlodipine-HCTZ (TRIBENZOR) 40-10-25 MG TABS take 1 tablet by mouth once daily keep appt 8/14   No facility-administered encounter medications on file as  of 04/21/2017.     Review of Systems: No fevers, chills, night sweats, weight loss, chest pain, or shortness of breath.   Objective:    General: Well Developed, well nourished, and in no acute distress.  Neuro: Alert and oriented x3, extra-ocular muscles intact, sensation grossly intact.  HEENT: Normocephalic, atraumatic  Skin: Warm and dry, no rashes. Cardiac: Regular rate and rhythm, no murmurs rubs or gallops, no lower extremity edema.  Respiratory: Clear to auscultation bilaterally. Not using accessory muscles, speaking in full sentences.   Impression and Recommendations:    CPE  Keep up a regular exercise program and make sure you are eating a healthy diet Try to eat 4 servings of dairy a day, or if you are lactose intolerant take a calcium with vitamin D daily.  Your vaccines are up to date.    HTN - Well controlled. Continue current regimen. Follow up in  6 months. Due for labs.   IFG - A1c elevated today from previous. Encouraged him to make sure he's getting back onk with diet exercise and maintaining a healthy weight. Recheck in 6 months.

## 2017-04-21 NOTE — Patient Instructions (Signed)
Keep up a regular exercise program and make sure you are eating a healthy diet Try to eat 4 servings of dairy a day, or if you are lactose intolerant take a calcium with vitamin D daily.  Your vaccines are up to date.   

## 2017-04-22 LAB — COMPLETE METABOLIC PANEL WITH GFR
ALBUMIN: 4.2 g/dL (ref 3.6–5.1)
ALK PHOS: 78 U/L (ref 40–115)
ALT: 24 U/L (ref 9–46)
AST: 20 U/L (ref 10–35)
BUN: 17 mg/dL (ref 7–25)
CALCIUM: 9.2 mg/dL (ref 8.6–10.3)
CHLORIDE: 106 mmol/L (ref 98–110)
CO2: 19 mmol/L — ABNORMAL LOW (ref 20–32)
Creat: 1.18 mg/dL (ref 0.70–1.33)
GFR, EST AFRICAN AMERICAN: 80 mL/min (ref >=60)
GFR, EST NON AFRICAN AMERICAN: 69 mL/min (ref >=60)
Glucose, Bld: 106 mg/dL — ABNORMAL HIGH (ref 65–99)
POTASSIUM: 3.6 mmol/L (ref 3.5–5.3)
Sodium: 142 mmol/L (ref 135–146)
Total Bilirubin: 0.4 mg/dL (ref 0.2–1.2)
Total Protein: 7.7 g/dL (ref 6.1–8.1)

## 2017-04-22 LAB — LIPID PANEL W/REFLEX DIRECT LDL
CHOL/HDL RATIO: 4.1 ratio (ref <5.0)
CHOLESTEROL: 142 mg/dL (ref <200)
HDL: 35 mg/dL — ABNORMAL LOW (ref >40)
LDL-Cholesterol: 84 mg/dL
NON-HDL CHOLESTEROL (CALC): 107 mg/dL (ref <130)
Triglycerides: 125 mg/dL (ref <150)

## 2017-04-22 LAB — PSA: PSA: 0.5 ng/mL (ref <=4.0)

## 2017-12-08 ENCOUNTER — Other Ambulatory Visit: Payer: Self-pay | Admitting: Family Medicine

## 2017-12-08 DIAGNOSIS — I10 Essential (primary) hypertension: Secondary | ICD-10-CM

## 2017-12-14 ENCOUNTER — Other Ambulatory Visit: Payer: Self-pay | Admitting: Family Medicine

## 2017-12-14 DIAGNOSIS — I10 Essential (primary) hypertension: Secondary | ICD-10-CM

## 2017-12-23 ENCOUNTER — Ambulatory Visit (INDEPENDENT_AMBULATORY_CARE_PROVIDER_SITE_OTHER): Payer: BC Managed Care – PPO | Admitting: Family Medicine

## 2017-12-23 ENCOUNTER — Encounter: Payer: Self-pay | Admitting: Family Medicine

## 2017-12-23 VITALS — BP 132/74 | HR 63 | Ht 72.0 in | Wt 250.0 lb

## 2017-12-23 DIAGNOSIS — Z23 Encounter for immunization: Secondary | ICD-10-CM

## 2017-12-23 DIAGNOSIS — M25561 Pain in right knee: Secondary | ICD-10-CM | POA: Diagnosis not present

## 2017-12-23 DIAGNOSIS — R7301 Impaired fasting glucose: Secondary | ICD-10-CM

## 2017-12-23 DIAGNOSIS — G8929 Other chronic pain: Secondary | ICD-10-CM

## 2017-12-23 DIAGNOSIS — Z6833 Body mass index (BMI) 33.0-33.9, adult: Secondary | ICD-10-CM | POA: Diagnosis not present

## 2017-12-23 DIAGNOSIS — I10 Essential (primary) hypertension: Secondary | ICD-10-CM | POA: Diagnosis not present

## 2017-12-23 LAB — BASIC METABOLIC PANEL WITH GFR
BUN: 14 mg/dL (ref 7–25)
CALCIUM: 9.1 mg/dL (ref 8.6–10.3)
CHLORIDE: 106 mmol/L (ref 98–110)
CO2: 29 mmol/L (ref 20–32)
Creat: 1.12 mg/dL (ref 0.70–1.33)
GFR, Est African American: 85 mL/min/{1.73_m2} (ref >=60)
GFR, Est Non African American: 73 mL/min/{1.73_m2} (ref >=60)
Glucose, Bld: 111 mg/dL — ABNORMAL HIGH (ref 65–99)
POTASSIUM: 4 mmol/L (ref 3.5–5.3)
Sodium: 139 mmol/L (ref 135–146)

## 2017-12-23 LAB — POCT GLYCOSYLATED HEMOGLOBIN (HGB A1C): HEMOGLOBIN A1C: 5.8

## 2017-12-23 NOTE — Progress Notes (Signed)
Subjective:    CC: BP and glucose  HPI:  Hypertension- Pt denies chest pain, SOB, dizziness, or heart palpitations.  Taking meds as directed w/o problems.  Denies medication side effects.    Impaired fasting glucose-no increased thirst or urination. No symptoms consistent with hypoglycemia.  He retired and now has a part-time job working as a Presenter, broadcasting.  He is been walking a lot every day more than he ever has in years and in fact he is actually lost 14 pounds because of increased activity level even without changing his diet.  He also complains of right knee pain that has been persistent for quite some time but it bothered him more since he is walking more with his new job.  It is worse if he is working on concrete hard floors versus if he is walking out in the grass.  He has to wear a knee brace when he works otherwise it is extremely painful.  It does sometimes swell and feel warm to touch.  Past medical history, Surgical history, Family history not pertinant except as noted below, Social history, Allergies, and medications have been entered into the medical record, reviewed, and corrections made.   Review of Systems: No fevers, chills, night sweats, weight loss, chest pain, or shortness of breath.   Objective:    General: Well Developed, well nourished, and in no acute distress.  Neuro: Alert and oriented x3, extra-ocular muscles intact, sensation grossly intact.  HEENT: Normocephalic, atraumatic  Skin: Warm and dry, no rashes. Cardiac: Regular rate and rhythm, no murmurs rubs or gallops, no lower extremity edema.  Respiratory: Clear to auscultation bilaterally. Not using accessory muscles, speaking in full sentences. MSK: Left knee with decreased flexion and unable to fully extend.  There is some warmth over the joint.  Tender along the medial joint line.  He has some is no movement to the patella.  Negative McMurray's.   Impression and Recommendations:    HTN - Well  controlled. Continue current regimen. Follow up in   24months.    IFG - Well controlled.  In fact, his hemoglobin A1c is down to 5.8 which is absolutely fantastic.  Continue current regimen. Follow up in  6 months.    Overweight/BMI 33-I congratulated him on the weight loss is absolutely fantastic.  I would love to see him lose another 14 pounds over the next 6 months.  This will help hopefully reduce some of his joint pain as well as continue to improve his blood pressure and his A1c.  Right knee pain-[facility].  Suspect that he has some severe OA in that knee. He may end up needing some type of surgical procedure.  Next step would really be to get an x-ray at this point in time as he has some tenderness along the medial joint line, warmth and inflammation in the joint and persistent pain.  He is not able to get into the [place] then please let me know and we can always order an x-ray.

## 2017-12-24 NOTE — Progress Notes (Signed)
All labs are normal. 

## 2018-01-04 ENCOUNTER — Other Ambulatory Visit: Payer: Self-pay | Admitting: Family Medicine

## 2018-01-04 DIAGNOSIS — I10 Essential (primary) hypertension: Secondary | ICD-10-CM

## 2018-03-01 ENCOUNTER — Other Ambulatory Visit: Payer: Self-pay | Admitting: Family Medicine

## 2018-03-01 DIAGNOSIS — I10 Essential (primary) hypertension: Secondary | ICD-10-CM

## 2018-04-23 ENCOUNTER — Other Ambulatory Visit: Payer: Self-pay | Admitting: Family Medicine

## 2018-04-23 DIAGNOSIS — I10 Essential (primary) hypertension: Secondary | ICD-10-CM

## 2018-12-03 ENCOUNTER — Other Ambulatory Visit: Payer: Self-pay

## 2018-12-03 ENCOUNTER — Other Ambulatory Visit: Payer: Self-pay | Admitting: Family Medicine

## 2018-12-03 ENCOUNTER — Encounter: Payer: Self-pay | Admitting: Family Medicine

## 2018-12-03 ENCOUNTER — Ambulatory Visit (INDEPENDENT_AMBULATORY_CARE_PROVIDER_SITE_OTHER): Payer: BC Managed Care – PPO | Admitting: Family Medicine

## 2018-12-03 VITALS — BP 151/95 | Wt 252.0 lb

## 2018-12-03 DIAGNOSIS — R7301 Impaired fasting glucose: Secondary | ICD-10-CM | POA: Diagnosis not present

## 2018-12-03 DIAGNOSIS — I10 Essential (primary) hypertension: Secondary | ICD-10-CM | POA: Diagnosis not present

## 2018-12-03 DIAGNOSIS — Z125 Encounter for screening for malignant neoplasm of prostate: Secondary | ICD-10-CM | POA: Diagnosis not present

## 2018-12-03 MED ORDER — OLMESARTAN-AMLODIPINE-HCTZ 40-10-25 MG PO TABS: 1.0000 | ORAL_TABLET | Freq: Every day | ORAL | 1 refills | Status: DC

## 2018-12-03 MED ORDER — CARVEDILOL 25 MG PO TABS: 25.0000 mg | ORAL_TABLET | Freq: Two times a day (BID) | ORAL | 1 refills | Status: DC

## 2018-12-03 NOTE — Progress Notes (Signed)
Pt doing well on current regimen. He was trying to check BP at the time and was unable. Advised him to try to get this done by his appt time w/Dr. Madilyn Fireman.Maryruth Eve, Lahoma Crocker, CMA

## 2018-12-03 NOTE — Telephone Encounter (Signed)
Please have patient do a vitual visit. Needs vitals before appt.

## 2018-12-03 NOTE — Progress Notes (Signed)
Virtual Visit via Telephone Note  I connected with Philip Shelton on 12/03/18 at  4:00 PM EDT by telephone and verified that I am speaking with the correct person using two identifiers.   I discussed the limitations, risks, security and privacy concerns of performing an evaluation and management service by telephone and the availability of in person appointments. I also discussed with the patient that there may be a patient responsible charge related to this service. The patient expressed understanding and agreed to proceed.   Subjective:    CC: HTN  HPI:  Hypertension- Pt denies chest pain, SOB, dizziness, or heart palpitations.  Taking meds as directed w/o problems.  Denies medication side effects.  NO HA.    Impaired fasting glucose-no increased thirst or urination. No symptoms consistent with hypoglycemia.   Past medical history, Surgical history, Family history not pertinant except as noted below, Social history, Allergies, and medications have been entered into the medical record, reviewed, and corrections made.   Review of Systems: No fevers, chills, night sweats, weight loss, chest pain, or shortness of breath.   Objective:    General: Speaking clearly in complete sentences without any shortness of breath.  Alert and oriented x3.  Normal judgment. No apparent acute distress.    Impression and Recommendations:    HTN -blood pressure was elevated today.  He actually to her local pharmacy to get his pressure he did not have a home cuff.  The last couple times he has been here even though it has been quite a while it was normal.  His weight actually looks good and in fact he has lost a couple pounds since he was last here so just continue work on healthy diet.  Reminded him to make sure he is eating low-salt and to work on keeping stress levels under good control.  For now him to keep his blood pressure medications the same even though it is running a little higher just because I  am worried that this may be an outlier blood pressure without him having a home cuff.  I would like to have him come in in about 6 to 8 weeks for a nurse visit to check blood pressure this to make sure that we are on track.  I will send refills to the pharmacy.  He is very overdue for blood work and strongly encouraged him to go on Monday or Tuesday of next week to have that done we just need to make sure that his renal function potassium etc. is well controlled.  IFG -to recheck hemoglobin is A1c.  Will call with results once he goes to the lab.   I discussed the assessment and treatment plan with the patient. The patient was provided an opportunity to ask questions and all were answered. The patient agreed with the plan and demonstrated an understanding of the instructions.   The patient was advised to call back or seek an in-person evaluation if the symptoms worsen or if the condition fails to improve as anticipated.  I provided 5 minutes of non-face-to-face time during this encounter.   Beatrice Lecher, MD

## 2018-12-06 NOTE — Telephone Encounter (Signed)
Left message asking patient to return call to schedule a virtual visit.

## 2018-12-09 LAB — COMPLETE METABOLIC PANEL WITH GFR
AG RATIO: 1.1 (calc) (ref 1.0–2.5)
ALBUMIN MSPROF: 3.9 g/dL (ref 3.6–5.1)
ALT: 15 U/L (ref 9–46)
AST: 13 U/L (ref 10–35)
Alkaline phosphatase (APISO): 60 U/L (ref 35–144)
BUN: 13 mg/dL (ref 7–25)
CALCIUM: 8.9 mg/dL (ref 8.6–10.3)
CO2: 27 mmol/L (ref 20–32)
Chloride: 107 mmol/L (ref 98–110)
Creat: 1.11 mg/dL (ref 0.70–1.33)
GFR, EST AFRICAN AMERICAN: 85 mL/min/{1.73_m2} (ref >=60)
GFR, EST NON AFRICAN AMERICAN: 73 mL/min/{1.73_m2} (ref >=60)
GLOBULIN: 3.4 g/dL (ref 1.9–3.7)
Glucose, Bld: 101 mg/dL — ABNORMAL HIGH (ref 65–99)
POTASSIUM: 4.5 mmol/L (ref 3.5–5.3)
SODIUM: 141 mmol/L (ref 135–146)
TOTAL PROTEIN: 7.3 g/dL (ref 6.1–8.1)
Total Bilirubin: 0.3 mg/dL (ref 0.2–1.2)

## 2018-12-09 LAB — LIPID PANEL
CHOL/HDL RATIO: 3.2 (calc) (ref <5.0)
Cholesterol: 107 mg/dL (ref <200)
HDL: 33 mg/dL — AB (ref >=40)
LDL Cholesterol (Calc): 56 mg/dL (calc)
NON-HDL CHOLESTEROL (CALC): 74 mg/dL (ref <130)
Triglycerides: 99 mg/dL (ref <150)

## 2018-12-09 LAB — HEMOGLOBIN A1C
Hgb A1c MFr Bld: 6 % of total Hgb — ABNORMAL HIGH (ref <5.7)
Mean Plasma Glucose: 126 (calc)
eAG (mmol/L): 7 (calc)

## 2018-12-09 LAB — PSA: PSA: 0.6 ng/mL (ref <=4.0)

## 2019-06-01 ENCOUNTER — Other Ambulatory Visit: Payer: Self-pay | Admitting: Family Medicine

## 2019-06-01 DIAGNOSIS — I10 Essential (primary) hypertension: Secondary | ICD-10-CM

## 2019-07-07 ENCOUNTER — Inpatient Hospital Stay: Payer: BC Managed Care – PPO | Admitting: Family Medicine

## 2019-07-08 ENCOUNTER — Ambulatory Visit (INDEPENDENT_AMBULATORY_CARE_PROVIDER_SITE_OTHER): Payer: BC Managed Care – PPO | Admitting: Family Medicine

## 2019-07-08 ENCOUNTER — Other Ambulatory Visit: Payer: Self-pay

## 2019-07-08 ENCOUNTER — Encounter: Payer: Self-pay | Admitting: Family Medicine

## 2019-07-08 VITALS — BP 136/76 | HR 59 | Ht 72.0 in | Wt 248.0 lb

## 2019-07-08 DIAGNOSIS — M25561 Pain in right knee: Secondary | ICD-10-CM

## 2019-07-08 DIAGNOSIS — R7301 Impaired fasting glucose: Secondary | ICD-10-CM

## 2019-07-08 DIAGNOSIS — G510 Bell's palsy: Secondary | ICD-10-CM

## 2019-07-08 LAB — POCT GLYCOSYLATED HEMOGLOBIN (HGB A1C): Hemoglobin A1C: 5.9 % — AB (ref 4.0–5.6)

## 2019-07-08 MED ORDER — PREDNISONE 20 MG PO TABS: 40.0000 mg | ORAL_TABLET | Freq: Every day | ORAL | 0 refills | Status: AC

## 2019-07-08 NOTE — Progress Notes (Signed)
Established Patient Office Visit  Subjective:  Patient ID: Philip Shelton, male    DOB: November 19, 1960  Age: 58 y.o. MRN: [id number]  CC:  Chief Complaint  Patient presents with  . Hospitalization Follow-up   HPI Philip Shelton is a 58 year old male with a history of hypertension and prediabetes who presented to the emergency department on October 25.  He presented with left-sided facial droop and no other neurologic symptoms.  He did have a head CTA which was negative and he was diagnosed with Bell's palsy.  He is here today for follow-up.  He was given valacyclovir and prednisone.  Right knee pain-he says that the prednisone has actually been a miracle for his right knee.  His pain has gone away and he says he is even walking without a limp.  He says even his wife noticed.  He wants to know if he can get a little bit more prednisone.  He has never been formally worked up for knee pain no prior history of a gout diagnosis.  Impaired fasting glucose-no increased thirst or urination. No symptoms consistent with hypoglycemia.   Past Medical History:  Diagnosis Date  . Hypertension     History reviewed. No pertinent surgical history.  Family History  Problem Relation Age of Onset  . Hypertension Mother   . Hypertension Father   . Breast cancer Mother     Social History   Socioeconomic History  . Marital status: Married    Spouse name: Not on file  . Number of children: Not on file  . Years of education: Not on file  . Highest education level: Not on file  Occupational History  . Not on file  Social Needs  . Financial resource strain: Not on file  . Food insecurity    Worry: Not on file    Inability: Not on file  . Transportation needs    Medical: Not on file    Non-medical: Not on file  Tobacco Use  . Smoking status: Never Smoker  . Smokeless tobacco: Never Used  Substance and Sexual Activity  . Alcohol use: No  . Drug use: No  . Sexual activity: Not on file   Comment: correction sargent @ [employer], married, 2 kids, regularly exercises, walks, weights, runs daily, 1-2 cups caffeine daily.  Lifestyle  . Physical activity    Days per week: Not on file    Minutes per session: Not on file  . Stress: Not on file  Relationships  . Social Herbalist on phone: Not on file    Gets together: Not on file    Attends religious service: Not on file    Active member of club or organization: Not on file    Attends meetings of clubs or organizations: Not on file    Relationship status: Not on file  . Intimate partner violence    Fear of current or ex partner: Not on file    Emotionally abused: Not on file    Physically abused: Not on file    Forced sexual activity: Not on file  Other Topics Concern  . Not on file  Social History Narrative  . Not on file    Outpatient Medications Prior to Visit  Medication Sig Dispense Refill  . carvedilol (COREG) 25 MG tablet Take 1 tablet (25 mg total) by mouth 2 (two) times daily with a meal. 180 tablet 1  . Olmesartan-amLODIPine-HCTZ 40-10-25 MG TABS TAKE 1 TABLET BY MOUTH  EVERY DAY 90 tablet 0  . valACYclovir (VALTREX) 1000 MG tablet Take 1,000 mg by mouth 3 (three) times daily.    . predniSONE (DELTASONE) 20 MG tablet Take 60 mg by mouth daily.     No facility-administered medications prior to visit.     No Known Allergies  ROS Review of Systems    Objective:    Physical Exam  Constitutional: He is oriented to person, place, and time. He appears well-developed and well-nourished.  HENT:  Head: Normocephalic and atraumatic.  Cardiovascular: Normal rate, regular rhythm and normal heart sounds.  Pulmonary/Chest: Effort normal and breath sounds normal.  Neurological: He is alert and oriented to person, place, and time.  Skin: Skin is warm and dry.  Left eyebrow is slightly lowered.  He is unable to smile on the left side of his mouth.  He is just able to barely close his eyelid on  the left.  Psychiatric: He has a normal mood and affect. His behavior is normal.    BP 136/76   Pulse (!) 59   Ht 6' (1.829 m)   Wt 248 lb (112.5 kg)   SpO2 98%   BMI 33.63 kg/m  Wt Readings from Last 3 Encounters:  07/08/19 248 lb (112.5 kg)  12/03/18 252 lb (114.3 kg)  12/23/17 250 lb (113.4 kg)     There are no preventive care reminders to display for this patient.  There are no preventive care reminders to display for this patient.  Lab Results  Component Value Date   TSH 1.388 05/26/2012   Lab Results  Component Value Date   WBC 5.8 03/15/2013   HGB 14.5 03/15/2013   HCT 42.6 03/15/2013   MCV 85.9 03/15/2013   PLT 171 03/15/2013   Lab Results  Component Value Date   NA 141 12/08/2018   K 4.5 12/08/2018   CO2 27 12/08/2018   GLUCOSE 101 (H) 12/08/2018   BUN 13 12/08/2018   CREATININE 1.11 12/08/2018   BILITOT 0.3 12/08/2018   ALKPHOS 78 04/21/2017   AST 13 12/08/2018   ALT 15 12/08/2018   PROT 7.3 12/08/2018   ALBUMIN 4.2 04/21/2017   CALCIUM 8.9 12/08/2018   Lab Results  Component Value Date   CHOL 107 12/08/2018   Lab Results  Component Value Date   HDL 33 (L) 12/08/2018   Lab Results  Component Value Date   LDLCALC 56 12/08/2018   Lab Results  Component Value Date   TRIG 99 12/08/2018   Lab Results  Component Value Date   CHOLHDL 3.2 12/08/2018   Lab Results  Component Value Date   HGBA1C 5.9 (A) 07/08/2019      Assessment & Plan:   Problem List Items Addressed This Visit      Endocrine   IFG (impaired fasting glucose)     Lab Results  Component Value Date   HGBA1C 5.9 (A) 07/08/2019   In the pre-diabetes range. Continue to work on healthy diet and regular exercise.        Relevant Orders   POCT glycosylated hemoglobin (Hb A1C) (Completed)     Other   Right knee pain    Other Visit Diagnoses    Bell's palsy    -  Primary     Bells palsy-he actually feels like it is getting a little bit better gradually he still  taking the valacyclovir and still has about 3 more days on the prednisone.  Did encourage him to complete that.  Right  knee pain - will refill prednisone but needs further workup. Encouraged him to schedule f/u w/ sports medicine. Consider dx of gout since responded well to prednisone.    Meds ordered this encounter  Medications  . predniSONE (DELTASONE) 20 MG tablet    Sig: Take 2 tablets (40 mg total) by mouth daily for 5 days.    Dispense:  10 tablet    Refill:  0    Follow-up: Return in about 6 months (around 01/06/2020) for Hypertension.    Beatrice Lecher, MD

## 2019-07-11 NOTE — Assessment & Plan Note (Signed)
  Lab Results  Component Value Date   HGBA1C 5.9 (A) 07/08/2019   In the pre-diabetes range. Continue to work on healthy diet and regular exercise.

## 2019-10-03 ENCOUNTER — Other Ambulatory Visit: Payer: Self-pay | Admitting: *Deleted

## 2019-10-03 DIAGNOSIS — I10 Essential (primary) hypertension: Secondary | ICD-10-CM

## 2019-10-03 MED ORDER — OLMESARTAN-AMLODIPINE-HCTZ 40-10-25 MG PO TABS: 1.0000 | ORAL_TABLET | Freq: Every day | ORAL | 0 refills | Status: DC

## 2019-12-13 ENCOUNTER — Telehealth: Payer: Self-pay

## 2019-12-13 NOTE — Telephone Encounter (Signed)
Patient's wife called stating that he is COVID positive. She thought he was doing better, so she left him at home while she went to work yesterday. Upon her return, patient stated he passed out. Not sure of what happened as patient does not remember passing out. Patient is now afraid to walk or move. Wife is concerned.   I advised wife that patient needed to be evaluated ASAP at emergency room. Wife agreeable. Will take patient for evaluation tonight.   FYI to PCP

## 2019-12-20 ENCOUNTER — Telehealth (INDEPENDENT_AMBULATORY_CARE_PROVIDER_SITE_OTHER): Payer: BC Managed Care – PPO | Admitting: Family Medicine

## 2019-12-20 ENCOUNTER — Other Ambulatory Visit: Payer: Self-pay

## 2019-12-20 ENCOUNTER — Encounter: Payer: Self-pay | Admitting: Family Medicine

## 2019-12-20 VITALS — Temp 98.6°F

## 2019-12-20 DIAGNOSIS — I10 Essential (primary) hypertension: Secondary | ICD-10-CM

## 2019-12-20 DIAGNOSIS — U071 COVID-19: Secondary | ICD-10-CM

## 2019-12-20 MED ORDER — CARVEDILOL 25 MG PO TABS: 25.0000 mg | ORAL_TABLET | Freq: Two times a day (BID) | ORAL | 1 refills | Status: DC

## 2019-12-20 MED ORDER — OLMESARTAN-AMLODIPINE-HCTZ 40-10-25 MG PO TABS: 1.0000 | ORAL_TABLET | Freq: Every day | ORAL | 1 refills | Status: DC

## 2019-12-20 NOTE — Progress Notes (Signed)
Virtual Visit via Video Note  I connected with Philip Shelton on 12/20/19 at 10:10 AM EDT by a video enabled telemedicine application and verified that I am speaking with the correct person using two identifiers.   I discussed the limitations of evaluation and management by telemedicine and the availability of in person appointments. The patient expressed understanding and agreed to proceed.  Subjective:    CC: COVID-19.  HPI: 59 year old male with a history of hypertension reports that he was diagnosed with COVID-19 on April 2.  He said he had extreme weakness and muscle fatigue and even some fevers.  He actually ended up going to the emergency department on April 5 after he passed out.  He said he went to the bathroom and got dizzy and does not remember anything until he woke up because this phone was ringing he is not sure how long he was unconscious.  They can took him to the emergency department and he was dehydrated he also been having significant diarrhea but no vomiting.  He was given 2 bags of IV fluids and felt somewhat better afterwards.  Since then the fever has resolved though he has been using Tylenol for the muscle aches.  He denies any significant shortness of breath or cough he just feels extremely weak even just getting up and going to the bathroom as an effort.  He denies any rash or chest pain.  Just does not feel like he could return to work quite yet.   Past medical history, Surgical history, Family history not pertinant except as noted below, Social history, Allergies, and medications have been entered into the medical record, reviewed, and corrections made.   Review of Systems: No fevers, chills, night sweats, weight loss, chest pain, or shortness of breath.   Objective:    General: Speaking clearly in complete sentences without any shortness of breath.  Alert and oriented x3.  Normal judgment. No apparent acute distress.    Impression and Recommendations:    No  problem-specific Assessment & Plan notes found for this encounter.  COVID-19-it sounds like he is gradually getting some better.  We did discuss the importance of really just sitting up in a chair during the day for at least a few hours and not lying in bed too long working the muscle tissue and stretching his legs and arms to prevent any blood clots.  Also really want him to push fluids and encourage a little bit more food intake since he is starting to feel a little bit better and the diarrhea seems to have stopped.  Did give him warning signs and symptoms for when to return to the emergency department.  Right now he is not having any respiratory symptoms.  Work note provided return on April 26.    Time spent in encounter 22 minutes  I discussed the assessment and treatment plan with the patient. The patient was provided an opportunity to ask questions and all were answered. The patient agreed with the plan and demonstrated an understanding of the instructions.   The patient was advised to call back or seek an in-person evaluation if the symptoms worsen or if the condition fails to improve as anticipated.   Beatrice Lecher, MD

## 2019-12-21 ENCOUNTER — Telehealth: Payer: BC Managed Care – PPO | Admitting: Family Medicine

## 2019-12-23 ENCOUNTER — Telehealth: Payer: Self-pay

## 2019-12-23 DIAGNOSIS — I2699 Other pulmonary embolism without acute cor pulmonale: Secondary | ICD-10-CM | POA: Insufficient documentation

## 2019-12-23 MED ORDER — ALBUTEROL SULFATE HFA 108 (90 BASE) MCG/ACT IN AERS
2.00 | INHALATION_SPRAY | RESPIRATORY_TRACT | Status: DC
Start: ? — End: 2019-12-23

## 2019-12-23 MED ORDER — ENOXAPARIN SODIUM 120 MG/0.8ML ~~LOC~~ SOLN
1.00 | SUBCUTANEOUS | Status: DC
Start: 2019-12-23 — End: 2019-12-23

## 2019-12-23 MED ORDER — DEXAMETHASONE SODIUM PHOSPHATE 4 MG/ML IJ SOLN
10.00 | INTRAMUSCULAR | Status: DC
Start: 2019-12-23 — End: 2019-12-23

## 2019-12-23 MED ORDER — IOPAMIDOL (ISOVUE-370) INJECTION 76%
100.00 | INTRAVENOUS | Status: DC
Start: ? — End: 2019-12-23

## 2019-12-23 NOTE — Telephone Encounter (Signed)
Philip Shelton was having extreme weakness this morning. He went to ED.

## 2019-12-23 NOTE — Telephone Encounter (Signed)
Called and spoke to his wife Philip Shelton.  He is at the hospital she was not able to get in with him because he has Covid.  She was not sure if he was being admitted or not but I did review on care everywhere that it did look like he was being admitted so she is hoping for a phone call soon to give her an update.  She said that she had actually Covid in January but he did not get it at that time in fact he was actually planning on getting his vaccine on Wednesday.  She said this morning he was breathing very quickly and was very disoriented and was unable to even give his date of birth so she had actually called EMS.  Just gave her support and asked them to call us if she needs anything and that we will try to follow along with the case.  Will be praying for her and her family.

## 2019-12-26 MED ORDER — BARO-CAT PO
10.00 | ORAL | Status: DC
Start: ? — End: 2019-12-26

## 2019-12-26 MED ORDER — ESTROPLUS PO TABS
1.00 | ORAL_TABLET | ORAL | Status: DC
Start: ? — End: 2019-12-26

## 2019-12-26 MED ORDER — Medication
Status: DC
Start: ? — End: 2019-12-26

## 2019-12-26 MED ORDER — PIPERACILLIN SODIUM
5.00 | Status: DC
Start: ? — End: 2019-12-26

## 2019-12-26 MED ORDER — DEXAMETHASONE SODIUM PHOSPHATE 10 MG/ML IJ SOLN
6.00 | INTRAMUSCULAR | Status: DC
Start: 2020-01-03 — End: 2019-12-26

## 2019-12-26 MED ORDER — SUPER B-50 B COMPLEX PO
0.40 | ORAL | Status: DC
Start: ? — End: 2019-12-26

## 2019-12-26 MED ORDER — POTASSIUM BICARB-CITRIC ACID 20 MEQ PO TBEF
40.00 | EFFERVESCENT_TABLET | ORAL | Status: DC
Start: 2019-12-26 — End: 2019-12-26

## 2019-12-26 MED ORDER — NAPHAZOLINE HCL OP
2.00 | OPHTHALMIC | Status: DC
Start: ? — End: 2019-12-26

## 2019-12-26 MED ORDER — PANTOPRAZOLE SODIUM 40 MG IV SOLR
40.00 | INTRAVENOUS | Status: DC
Start: 2019-12-31 — End: 2019-12-26

## 2019-12-26 MED ORDER — Medication
650.00 | Status: DC
Start: ? — End: 2019-12-26

## 2019-12-26 MED ORDER — BL TUSSIN CF 30-10-100 MG/5ML PO SYRP
1.00 | ORAL_SOLUTION | ORAL | Status: DC
Start: ? — End: 2019-12-26

## 2019-12-26 MED ORDER — POLYETHYLENE GLYCOL 3350 17 GM/SCOOP PO POWD
17.00 | ORAL | Status: DC
Start: ? — End: 2019-12-26

## 2019-12-26 MED ORDER — GENERIC EXTERNAL MEDICATION
Status: DC
Start: ? — End: 2019-12-26

## 2019-12-26 MED ORDER — Medication
1.00 | Status: DC
Start: 2019-12-26 — End: 2019-12-26

## 2019-12-26 MED ORDER — MUPIROCIN 2 % EX OINT
TOPICAL_OINTMENT | CUTANEOUS | Status: DC
Start: 2019-12-26 — End: 2019-12-26

## 2019-12-26 MED ORDER — OATMEAL BATH OILATED EX PACK
200.00 | PACK | CUTANEOUS | Status: DC
Start: ? — End: 2019-12-26

## 2019-12-30 MED ORDER — GENERIC EXTERNAL MEDICATION
Status: DC
Start: ? — End: 2019-12-30

## 2019-12-30 MED ORDER — ENOXAPARIN SODIUM 120 MG/0.8ML ~~LOC~~ SOLN
1.00 | SUBCUTANEOUS | Status: DC
Start: 2020-01-03 — End: 2019-12-30

## 2019-12-30 MED ORDER — THERA PO TABS
1.00 | ORAL_TABLET | ORAL | Status: DC
Start: 2020-01-03 — End: 2019-12-30

## 2020-01-02 ENCOUNTER — Telehealth: Payer: Self-pay | Admitting: *Deleted

## 2020-01-02 MED ORDER — CARVEDILOL 25 MG PO TABS
25.00 | ORAL_TABLET | ORAL | Status: DC
Start: 2020-01-02 — End: 2020-01-02

## 2020-01-02 MED ORDER — PANTOPRAZOLE SODIUM 40 MG PO TBEC
40.00 | DELAYED_RELEASE_TABLET | ORAL | Status: DC
Start: 2020-01-03 — End: 2020-01-02

## 2020-01-02 MED ORDER — GENERIC EXTERNAL MEDICATION
Status: DC
Start: ? — End: 2020-01-02

## 2020-01-02 NOTE — Telephone Encounter (Signed)
Spoke w/pt's wife to obtain additional information about FMLA forms(i.e. 1st day of leave and approximate time she will need to be out, ect.) also advised her that she will need to come by to sign the form before we can send this in to her employer. She stated that she will come by tomorrow to do this.

## 2020-01-10 ENCOUNTER — Ambulatory Visit (INDEPENDENT_AMBULATORY_CARE_PROVIDER_SITE_OTHER): Payer: BC Managed Care – PPO | Admitting: Family Medicine

## 2020-01-10 DIAGNOSIS — M25561 Pain in right knee: Secondary | ICD-10-CM | POA: Diagnosis not present

## 2020-01-10 DIAGNOSIS — U071 COVID-19: Secondary | ICD-10-CM

## 2020-01-10 DIAGNOSIS — K029 Dental caries, unspecified: Secondary | ICD-10-CM | POA: Diagnosis not present

## 2020-01-10 DIAGNOSIS — I2699 Other pulmonary embolism without acute cor pulmonale: Secondary | ICD-10-CM

## 2020-01-10 DIAGNOSIS — R202 Paresthesia of skin: Secondary | ICD-10-CM

## 2020-01-10 DIAGNOSIS — G8929 Other chronic pain: Secondary | ICD-10-CM

## 2020-01-10 DIAGNOSIS — J1282 Pneumonia due to coronavirus disease 2019: Secondary | ICD-10-CM | POA: Insufficient documentation

## 2020-01-10 NOTE — Assessment & Plan Note (Signed)
Still oxygen dependent.  Improving daily.  Like to see him back in person in 1 week and at that point we can discuss whether or not he is able to drive.

## 2020-01-10 NOTE — Assessment & Plan Note (Signed)
Continue with Xarelto he will be switching to the 20 mg dose daily in about 3 days.  He says he does have the prescription.

## 2020-01-10 NOTE — Assessment & Plan Note (Signed)
Okay to use Tylenol for pain.  If any point he feels like they are becoming infected then please let me know right now he does not.  Unfortunately they would likely not extract teeth until he is off of the Xarelto.

## 2020-01-10 NOTE — Assessment & Plan Note (Signed)
He overall is getting much better he is able to talk without shortness of breath now.  He is using his oxygen intermittently but not consistently and we did discuss making sure that he really is using it if he feels that he needs it and not trying to withhold it.  Did just discussed with him that people do not become "dependent" on oxygen he is gone any time to recover.

## 2020-01-10 NOTE — Progress Notes (Signed)
Pt was released from the hospital last Wednesday.   He was sent home w/2L of O2, Albuterol inhaler PRN for SOB, Xarelto 15 mg x 7 days and then will start Xarelto 20 mg after that.  He reports that he has some teeth that are decayed and due to being on blood thinners he wanted to know his next steps and would like to get something for pain   He reports that he continues to have dried blood when he blows his nose.

## 2020-01-10 NOTE — Assessment & Plan Note (Signed)
Strongly suspect gout.  He is on prednisone right now and says he notices a huge difference in that right knee pain.  We will get a uric acid level on blood work.

## 2020-01-10 NOTE — Progress Notes (Signed)
Virtual Visit via Video Note  Hospital follow-up:    I connected with Philip Shelton on 01/11/20 at  2:00 PM EDT by a video enabled telemedicine application and verified that I am speaking with the correct person using two identifiers.   I discussed the limitations of evaluation and management by telemedicine and the availability of in person appointments. The patient expressed understanding and agreed to proceed.  Subjective:    CC: Follow-up recent hospitalization.  HPI: 59 year old male is following up today for recent hospitalization for pulmonary embolism secondary to COVID-19.  He was admitted to [place] [facility] on April 16 and discharged home 12 days later on April 28.  He was discharged home with Xarelto and albuterol.  Positive for Covid back on May 5.  He actually presented to the emergency department on April 16 with increased shortness of breath and significant weakness.  He was treated with 15 L of nonrebreather.  He was placed on BiPAP to maintain his oxygen levels.  He was noted to have pulmonary embolism multiple levels in the pulmonary arteries as well as Covid pneumonia.  He was discharged home on oxygen.  Pt was released from the hospital last Wednesday.   He was sent home w/2L of O2, Albuterol inhaler PRN for SOB, Xarelto 15 mg x 7 days and then will start Xarelto 20 mg after that.  He reports that he has some teeth that are decayed and due to being on blood thinners he wanted to know his next steps and would like to get something for pain   He reports that he continues to have dried blood when he blows his nose.  He wanted to know when he could start driving again.  So let me know his been having a little bit of cold sensation in his left foot mostly over the toes.  He denies any injury or trauma.  He says it does not feel cold to touch is really just more the sensation.  Has not noticed any discoloration, rash etc.  It seems to come  and go in fact he said he does not feel it right now.   His wife was present for the visit.  Past medical history, Surgical history, Family history not pertinant except as noted below, Social history, Allergies, and medications have been entered into the medical record, reviewed, and corrections made.   Review of Systems: No fevers, chills, night sweats, weight loss, chest pain, or shortness of breath.   Objective:    General: Speaking clearly in complete sentences without any shortness of breath.  Alert and oriented x3.  Normal judgment. No apparent acute distress.    Impression and Recommendations:    PE (pulmonary thromboembolism) ([place]) Continue with Xarelto he will be switching to the 20 mg dose daily in about 3 days.  He says he does have the prescription.  Pneumonia due to COVID-19 virus He overall is getting much better he is able to talk without shortness of breath now.  He is using his oxygen intermittently but not consistently and we did discuss making sure that he really is using it if he feels that he needs it and not trying to withhold it.  Did just discussed with him that people do not become "dependent" on oxygen he is gone any time to recover.  Dental decay Okay to use Tylenol for pain.  If any point he feels like they are becoming infected then please let me know right now he  does not.  Unfortunately they would likely not extract teeth until he is off of the Xarelto.  Right knee pain Strongly suspect gout.  He is on prednisone right now and says he notices a huge difference in that right knee pain.  We will get a uric acid level on blood work.  COVID-19 Still oxygen dependent.  Improving daily.  Like to see him back in person in 1 week and at that point we can discuss whether or not he is able to drive.      Time spent in encounter 30 minutes  I discussed the assessment and treatment plan with the patient. The patient was provided an opportunity to ask questions  and all were answered. The patient agreed with the plan and demonstrated an understanding of the instructions.   The patient was advised to call back or seek an in-person evaluation if the symptoms worsen or if the condition fails to improve as anticipated.   Beatrice Lecher, MD

## 2020-01-11 ENCOUNTER — Encounter: Payer: Self-pay | Admitting: Family Medicine

## 2020-01-13 ENCOUNTER — Other Ambulatory Visit: Payer: Self-pay | Admitting: Neurology

## 2020-01-13 DIAGNOSIS — E611 Iron deficiency: Secondary | ICD-10-CM

## 2020-01-13 LAB — BASIC METABOLIC PANEL WITH GFR
BUN: 9 mg/dL (ref 7–25)
CO2: 29 mmol/L (ref 20–32)
Calcium: 8.9 mg/dL (ref 8.6–10.3)
Chloride: 107 mmol/L (ref 98–110)
Creat: 0.85 mg/dL (ref 0.70–1.33)
GFR, Est African American: 111 mL/min/{1.73_m2} (ref >=60)
GFR, Est Non African American: 96 mL/min/{1.73_m2} (ref >=60)
Glucose, Bld: 103 mg/dL — ABNORMAL HIGH (ref 65–99)
Potassium: 4.5 mmol/L (ref 3.5–5.3)
Sodium: 142 mmol/L (ref 135–146)

## 2020-01-13 LAB — HEMOGLOBIN A1C
Hgb A1c MFr Bld: 6.1 % of total Hgb — ABNORMAL HIGH (ref <5.7)
Mean Plasma Glucose: 128 (calc)
eAG (mmol/L): 7.1 (calc)

## 2020-01-13 LAB — CBC
HCT: 36.8 % — ABNORMAL LOW (ref 38.5–50.0)
Hemoglobin: 12.3 g/dL — ABNORMAL LOW (ref 13.2–17.1)
MCH: 28.9 pg (ref 27.0–33.0)
MCHC: 33.4 g/dL (ref 32.0–36.0)
MCV: 86.4 fL (ref 80.0–100.0)
MPV: 10.4 fL (ref 7.5–12.5)
Platelets: 199 10*3/uL (ref 140–400)
RBC: 4.26 10*6/uL (ref 4.20–5.80)
RDW: 13.1 % (ref 11.0–15.0)
WBC: 5.9 10*3/uL (ref 3.8–10.8)

## 2020-01-13 LAB — SEDIMENTATION RATE: Sed Rate: 58 mm/h — ABNORMAL HIGH (ref 0–20)

## 2020-01-13 LAB — URIC ACID: Uric Acid, Serum: 5.7 mg/dL (ref 4.0–8.0)

## 2020-01-13 LAB — PROTIME-INR
INR: 1.2 — ABNORMAL HIGH
Prothrombin Time: 12.4 s — ABNORMAL HIGH (ref 9.0–11.5)

## 2020-01-17 ENCOUNTER — Encounter: Payer: Self-pay | Admitting: Family Medicine

## 2020-01-17 ENCOUNTER — Other Ambulatory Visit: Payer: Self-pay

## 2020-01-17 ENCOUNTER — Ambulatory Visit (INDEPENDENT_AMBULATORY_CARE_PROVIDER_SITE_OTHER): Payer: BC Managed Care – PPO | Admitting: Family Medicine

## 2020-01-17 VITALS — BP 129/80 | HR 74 | Ht 72.0 in | Wt 236.0 lb

## 2020-01-17 DIAGNOSIS — U071 COVID-19: Secondary | ICD-10-CM

## 2020-01-17 DIAGNOSIS — M25561 Pain in right knee: Secondary | ICD-10-CM

## 2020-01-17 DIAGNOSIS — R202 Paresthesia of skin: Secondary | ICD-10-CM

## 2020-01-17 DIAGNOSIS — R05 Cough: Secondary | ICD-10-CM

## 2020-01-17 DIAGNOSIS — R059 Cough, unspecified: Secondary | ICD-10-CM

## 2020-01-17 DIAGNOSIS — J1282 Pneumonia due to coronavirus disease 2019: Secondary | ICD-10-CM

## 2020-01-17 DIAGNOSIS — I2699 Other pulmonary embolism without acute cor pulmonale: Secondary | ICD-10-CM | POA: Diagnosis not present

## 2020-01-17 NOTE — Assessment & Plan Note (Signed)
Improving.  Still oxygen dependent but using it more as needed.  Did have him try to attempt to do a 6-minute walk test today.  Continue to wear overnight oxygen.  Once he is able to come off of it completely during the day then the plan will be to get an overnight pulse ox just to make sure that he is able to come off at night.  Continue Xarelto.

## 2020-01-17 NOTE — Assessment & Plan Note (Signed)
Lungs are clear on exam but still has a chronic upper respiratory cough.

## 2020-01-17 NOTE — Patient Instructions (Signed)
Also recommend that you make an appointment with Dr. Darene Lamer here in our office for possible injection in your left knee.  He can look at it and make a recommendation for you.

## 2020-01-17 NOTE — Progress Notes (Signed)
He reports that he is trying to "wean" off of the O2. He only wears this at night or if he's out and gets exerted.

## 2020-01-17 NOTE — Progress Notes (Addendum)
Established Patient Office Visit  Subjective:    Patient ID: Philip Shelton, male    DOB: Mar 17, 1961  Age: 59 y.o. MRN: [id number]  CC:  Chief Complaint  Patient presents with  . Pneumonia  . pulmonary emolism    HPI Philip Shelton presents for persistent cough shortness of breath and after bilateral pulmonary embolisms from COVID-19.  He says he is gradually getting a little bit better he feels like his energy level is better he feels like his appetite is definitely rebounded.  He still has a tickling cough in the back of his throat mostly dry.  He denies any chest pain, or swelling.  He does have some shortness of breath with activity he is noticing he has been able to start to wean his oxygen some so is only using it at night and if he feels like he needs it during the day.  Having some left knee pain.  Every time he is taking prednisone he actually feels much better.  Uric acid levels have been normal in the past.  Also wants to know when he is able to drive.  And I went to get vaccinated for Covid.  Also still getting some coldness and numbness in his feet usually if she is just been sitting for long period of time he says it goes away pretty quickly when he stands up and moves around and just wants to make sure that that is okay.  Past Medical History:  Diagnosis Date  . Hypertension     No past surgical history on file.  Family History  Problem Relation Age of Onset  . Hypertension Mother   . Hypertension Father   . Breast cancer Mother     Social History   Socioeconomic History  . Marital status: Married    Spouse name: Not on file  . Number of children: Not on file  . Years of education: Not on file  . Highest education level: Not on file  Occupational History  . Not on file  Tobacco Use  . Smoking status: Never Smoker  . Smokeless tobacco: Never Used  Substance and Sexual Activity  . Alcohol use: No  . Drug use: No  . Sexual activity: Not on file   Comment: correction sargent @ [employer], married, 2 kids, regularly exercises, walks, weights, runs daily, 1-2 cups caffeine daily.  Other Topics Concern  . Not on file  Social History Narrative  . Not on file   Social Determinants of Health   Financial Resource Strain:   . Difficulty of Paying Living Expenses:   Food Insecurity:   . Worried About Charity fundraiser in the Last Year:   . Arboriculturist in the Last Year:   Transportation Needs:   . Film/video editor (Medical):   Marland Kitchen Lack of Transportation (Non-Medical):   Physical Activity:   . Days of Exercise per Week:   . Minutes of Exercise per Session:   Stress:   . Feeling of Stress :   Social Connections:   . Frequency of Communication with Friends and Family:   . Frequency of Social Gatherings with Friends and Family:   . Attends Religious Services:   . Active Member of Clubs or Organizations:   . Attends Archivist Meetings:   Marland Kitchen Marital Status:   Intimate Partner Violence:   . Fear of Current or Ex-Partner:   . Emotionally Abused:   Marland Kitchen Physically Abused:   .  Sexually Abused:     Outpatient Medications Prior to Visit  Medication Sig Dispense Refill  . carvedilol (COREG) 25 MG tablet Take 1 tablet (25 mg total) by mouth 2 (two) times daily with a meal. 180 tablet 1  . Olmesartan-amLODIPine-HCTZ 40-10-25 MG TABS Take 1 tablet by mouth daily. 90 tablet 1  . rivaroxaban (XARELTO) 20 MG TABS tablet Take by mouth.    Marland Kitchen albuterol (VENTOLIN HFA) 108 (90 Base) MCG/ACT inhaler Inhale into the lungs.    . Rivaroxaban (XARELTO) 15 MG TABS tablet Take by mouth.     No facility-administered medications prior to visit.    No Known Allergies  ROS Review of Systems    Objective:    Physical Exam  Constitutional: He is oriented to person, place, and time. He appears well-developed and well-nourished.  HENT:  Head: Normocephalic and atraumatic.  Cardiovascular: Normal rate, regular rhythm and  normal heart sounds.  Pulmonary/Chest: Effort normal and breath sounds normal.  Neurological: He is alert and oriented to person, place, and time.  Skin: Skin is warm and dry.  Psychiatric: He has a normal mood and affect. His behavior is normal.    BP 129/80   Pulse 74   Ht 6' (1.829 m)   Wt 236 lb (107 kg)   SpO2 97%   BMI 32.01 kg/m  Wt Readings from Last 3 Encounters:  01/17/20 236 lb (107 kg)  07/08/19 248 lb (112.5 kg)  12/03/18 252 lb (114.3 kg)     There are no preventive care reminders to display for this patient.  There are no preventive care reminders to display for this patient.  Lab Results  Component Value Date   TSH 1.388 05/26/2012   Lab Results  Component Value Date   WBC 5.9 01/12/2020   HGB 12.3 (L) 01/12/2020   HCT 36.8 (L) 01/12/2020   MCV 86.4 01/12/2020   PLT 199 01/12/2020   Lab Results  Component Value Date   NA 142 01/12/2020   K 4.5 01/12/2020   CO2 29 01/12/2020   GLUCOSE 103 (H) 01/12/2020   BUN 9 01/12/2020   CREATININE 0.85 01/12/2020   BILITOT 0.3 12/08/2018   ALKPHOS 78 04/21/2017   AST 13 12/08/2018   ALT 15 12/08/2018   PROT 7.3 12/08/2018   ALBUMIN 4.2 04/21/2017   CALCIUM 8.9 01/12/2020   Lab Results  Component Value Date   CHOL 107 12/08/2018   Lab Results  Component Value Date   HDL 33 (L) 12/08/2018   Lab Results  Component Value Date   LDLCALC 56 12/08/2018   Lab Results  Component Value Date   TRIG 99 12/08/2018   Lab Results  Component Value Date   CHOLHDL 3.2 12/08/2018   Lab Results  Component Value Date   HGBA1C 6.1 (H) 01/12/2020      Assessment & Plan:   Problem List Items Addressed This Visit      Cardiovascular and Mediastinum   PE (pulmonary thromboembolism) ([place]) - Primary    Improving.  Still oxygen dependent but using it more as needed.  Did have him try to attempt to do a 6-minute walk test today.  Continue to wear overnight oxygen.  Once he is able to come off of it completely  during the day then the plan will be to get an overnight pulse ox just to make sure that he is able to come off at night.  Continue Xarelto.        Respiratory  Pneumonia due to COVID-19 virus    Lungs are clear on exam but still has a chronic upper respiratory cough.        Other   Right knee pain    Other Visit Diagnoses    Cough       Paresthesia of both feet         Right  knee-uric acid was normal so less likely to be gout.  He is always responded well to oral prednisone to discuss possibly doing an injection to see if that would be helpful will refer him to Dr. Dianah Field for further treatment and evaluation of that left knee.   Okay to get vaccinated against Covid in about 6 months.  Okay to drive locally did not recommend that he get on the highway.  Foot paresthesias with prolonged sitting this is unusual.  It can usually compress the back of the leg or knee or if he has his legs crossed it can sometimes cause the feet to feel cold or numb.  Just encouraged him to stay up and active especially as he is feeling much better.   Six Minute Walk - 01/17/20 1036      Six Minute Walk   Baseline BP (sitting)  130/73    Baseline Heartrate  72    Baseline SPO2  98 %      End of Test Values    BP (sitting)  127/72    Heartrate  71    SPO2  98 %   2L     2 Minutes Post Walk Values   BP (sitting)  115/73    Heartrate  70    SPO2  100 %   2L   Stopped or paused before six minutes?  Yes    Reason:  O2 dropped to 83% pt placed on 2L of oxygen        Follow-up: Return in about 3 weeks (around 02/07/2020) for SOB/Cough.    Beatrice Lecher, MD

## 2020-02-07 ENCOUNTER — Ambulatory Visit (INDEPENDENT_AMBULATORY_CARE_PROVIDER_SITE_OTHER): Payer: BC Managed Care – PPO

## 2020-02-07 ENCOUNTER — Ambulatory Visit (INDEPENDENT_AMBULATORY_CARE_PROVIDER_SITE_OTHER): Payer: BC Managed Care – PPO | Admitting: Family Medicine

## 2020-02-07 ENCOUNTER — Ambulatory Visit (INDEPENDENT_AMBULATORY_CARE_PROVIDER_SITE_OTHER): Payer: BC Managed Care – PPO | Admitting: Sports Medicine

## 2020-02-07 ENCOUNTER — Other Ambulatory Visit: Payer: Self-pay

## 2020-02-07 ENCOUNTER — Telehealth: Payer: Self-pay | Admitting: *Deleted

## 2020-02-07 ENCOUNTER — Encounter: Payer: Self-pay | Admitting: Family Medicine

## 2020-02-07 VITALS — BP 131/76 | HR 67 | Ht 72.0 in | Wt 242.0 lb

## 2020-02-07 DIAGNOSIS — I10 Essential (primary) hypertension: Secondary | ICD-10-CM | POA: Diagnosis not present

## 2020-02-07 DIAGNOSIS — M1711 Unilateral primary osteoarthritis, right knee: Secondary | ICD-10-CM

## 2020-02-07 DIAGNOSIS — G4700 Insomnia, unspecified: Secondary | ICD-10-CM

## 2020-02-07 DIAGNOSIS — I2699 Other pulmonary embolism without acute cor pulmonale: Secondary | ICD-10-CM | POA: Diagnosis not present

## 2020-02-07 DIAGNOSIS — R05 Cough: Secondary | ICD-10-CM | POA: Diagnosis not present

## 2020-02-07 DIAGNOSIS — R058 Other specified cough: Secondary | ICD-10-CM

## 2020-02-07 DIAGNOSIS — M1712 Unilateral primary osteoarthritis, left knee: Secondary | ICD-10-CM | POA: Diagnosis not present

## 2020-02-07 MED ORDER — RIVAROXABAN 20 MG PO TABS: 20.0000 mg | ORAL_TABLET | Freq: Every day | ORAL | 4 refills | Status: DC

## 2020-02-07 MED ORDER — AMBULATORY NON FORMULARY MEDICATION: 0 refills | Status: DC

## 2020-02-07 NOTE — Telephone Encounter (Signed)
Patient called back with the oxygen company information.. [id number] Philip Shelton

## 2020-02-07 NOTE — Assessment & Plan Note (Signed)
Goal would be to hopefully return him to work in about 2 weeks at least for half days.  He currently only works 3 days/week and works anywhere from 8 to 12 hours per shift.  [place] has Xarelto so he has enough until October and at that point he should be able to come off.

## 2020-02-07 NOTE — Progress Notes (Signed)
Established Patient Office Visit  Subjective:  Patient ID: Philip Shelton, male    DOB: 1961/03/16  Age: 59 y.o. MRN: [id number]  CC:  Chief Complaint  Patient presents with  . Follow-up    HPI Philip Shelton presents for   F/U PE - He reports that he's doing well. He stated that he gets "winded" when going to the bathroom.  He has 5 days left on the Xarelto. Wanted to know if he will have to continue this medication.  He has not been wearing the oxygen.  His wife wanted to know about getting an extension of her FMLA to help him while he is still at home.  F/U chronic SOB and cough s/p covid PNA. Failed 6 minute walk test 3 weeks ago. Dropped to 83%.  He says overall shortness of breath is significantly better.  Though still gets short of breath even just with activities around the house.  He also reports he is not sleeping well.  He says he typically takes anywhere from a 3 to 4-hour nap daily.  He says he was doing that even before he got sick.  But more recently he is just not been sleeping well at night.  He wakes up every couple hours and has difficulty going back to sleep.  Past Medical History:  Diagnosis Date  . Hypertension     History reviewed. No pertinent surgical history.  Family History  Problem Relation Age of Onset  . Hypertension Mother   . Hypertension Father   . Breast cancer Mother     Social History   Socioeconomic History  . Marital status: Married    Spouse name: Not on file  . Number of children: Not on file  . Years of education: Not on file  . Highest education level: Not on file  Occupational History  . Not on file  Tobacco Use  . Smoking status: Never Smoker  . Smokeless tobacco: Never Used  Substance and Sexual Activity  . Alcohol use: No  . Drug use: No  . Sexual activity: Not on file    Comment: correction sargent @ [employer], married, 2 kids, regularly exercises, walks, weights, runs daily, 1-2 cups caffeine daily.   Other Topics Concern  . Not on file  Social History Narrative  . Not on file   Social Determinants of Health   Financial Resource Strain:   . Difficulty of Paying Living Expenses:   Food Insecurity:   . Worried About Charity fundraiser in the Last Year:   . Arboriculturist in the Last Year:   Transportation Needs:   . Film/video editor (Medical):   Marland Kitchen Lack of Transportation (Non-Medical):   Physical Activity:   . Days of Exercise per Week:   . Minutes of Exercise per Session:   Stress:   . Feeling of Stress :   Social Connections:   . Frequency of Communication with Friends and Family:   . Frequency of Social Gatherings with Friends and Family:   . Attends Religious Services:   . Active Member of Clubs or Organizations:   . Attends Archivist Meetings:   Marland Kitchen Marital Status:   Intimate Partner Violence:   . Fear of Current or Ex-Partner:   . Emotionally Abused:   Marland Kitchen Physically Abused:   . Sexually Abused:     Outpatient Medications Prior to Visit  Medication Sig Dispense Refill  . carvedilol (COREG) 25 MG tablet Take 1  tablet (25 mg total) by mouth 2 (two) times daily with a meal. 180 tablet 1  . Olmesartan-amLODIPine-HCTZ 40-10-25 MG TABS Take 1 tablet by mouth daily. 90 tablet 1  . rivaroxaban (XARELTO) 20 MG TABS tablet Take by mouth.     No facility-administered medications prior to visit.    Allergies  Allergen Reactions  . Chocolate Hives and Itching    ROS Review of Systems    Objective:    Physical Exam  Constitutional: He is oriented to person, place, and time. He appears well-developed and well-nourished.  HENT:  Head: Normocephalic and atraumatic.  Cardiovascular: Normal rate, regular rhythm and normal heart sounds.  Pulmonary/Chest: Effort normal and breath sounds normal.  Neurological: He is alert and oriented to person, place, and time.  Skin: Skin is warm and dry.  Psychiatric: He has a normal mood and affect. His behavior is  normal.    BP 131/76   Pulse 67   Ht 6' (1.829 m)   Wt 242 lb (109.8 kg)   SpO2 99%   BMI 32.82 kg/m  Wt Readings from Last 3 Encounters:  02/07/20 242 lb (109.8 kg)  01/17/20 236 lb (107 kg)  07/08/19 248 lb (112.5 kg)     There are no preventive care reminders to display for this patient.  There are no preventive care reminders to display for this patient.  Lab Results  Component Value Date   TSH 1.388 05/26/2012   Lab Results  Component Value Date   WBC 5.9 01/12/2020   HGB 12.3 (L) 01/12/2020   HCT 36.8 (L) 01/12/2020   MCV 86.4 01/12/2020   PLT 199 01/12/2020   Lab Results  Component Value Date   NA 142 01/12/2020   K 4.5 01/12/2020   CO2 29 01/12/2020   GLUCOSE 103 (H) 01/12/2020   BUN 9 01/12/2020   CREATININE 0.85 01/12/2020   BILITOT 0.3 12/08/2018   ALKPHOS 78 04/21/2017   AST 13 12/08/2018   ALT 15 12/08/2018   PROT 7.3 12/08/2018   ALBUMIN 4.2 04/21/2017   CALCIUM 8.9 01/12/2020   Lab Results  Component Value Date   CHOL 107 12/08/2018   Lab Results  Component Value Date   HDL 33 (L) 12/08/2018   Lab Results  Component Value Date   LDLCALC 56 12/08/2018   Lab Results  Component Value Date   TRIG 99 12/08/2018   Lab Results  Component Value Date   CHOLHDL 3.2 12/08/2018   Lab Results  Component Value Date   HGBA1C 6.1 (H) 01/12/2020      Assessment & Plan:   Problem List Items Addressed This Visit      Cardiovascular and Mediastinum   PE (pulmonary thromboembolism) ([place]) - Primary    Goal would be to hopefully return him to work in about 2 weeks at least for half days.  He currently only works 3 days/week and works anywhere from 8 to 12 hours per shift.  [place] has Xarelto so he has enough until October and at that point he should be able to come off.      Relevant Medications   rivaroxaban (XARELTO) 20 MG TABS tablet   HYPERTENSION, BENIGN    Well controlled. Continue current regimen. Follow up in  3 months.        Relevant Medications   rivaroxaban (XARELTO) 20 MG TABS tablet     Respiratory   Post-viral cough syndrome    Should continually improve week by week.  Did.  Repeat his walk test today and he was able to complete it without dropping his oxygen less than 88%.  We can discontinue his supplemental oxygen       Other Visit Diagnoses    Insomnia, unspecified type         Insomnia-did discuss try to cut back on how much time he is napping though he was napping prior to illness and was sleeping great at night.  So we discussed maybe a trial of something like melatonin his wife says they do actually have some gummy melatonin at home but he has not tried it yet.  Could also even consider a trial of Benadryl.  May also be dropping his oxygen at night that is always a possibility as well.  If it is not helping then we could consider something like trazodone.  He still has a lot of anxiety around his shortness of breath and per his wife is still sleeping with the door open which usually he would sleep with a closed previously.   Meds ordered this encounter  Medications  . rivaroxaban (XARELTO) 20 MG TABS tablet    Sig: Take 1 tablet (20 mg total) by mouth daily with supper.    Dispense:  30 tablet    Refill:  4    Follow-up: Return in about 3 months (around 05/09/2020) for bp.    Beatrice Lecher, MD

## 2020-02-07 NOTE — Addendum Note (Signed)
Addended by: Silverio Decamp on: 02/07/2020 11:20 AM   Modules accepted: Orders

## 2020-02-07 NOTE — Assessment & Plan Note (Signed)
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 He has been taking some ibuprofen without much improvement. Today we injected his knee for symptomatic relief, he will get x-rays, home physical therapy. I do however think that he is very close to a knee arthroplasty. He is recently out of the hospital for COVID-19, so he will not be a candidate for knee arthroplasty for some time. Certainly if he does not get good relief from a steroid injection we can consider genicular nerve radiofrequency ablation or Coolief ablation.

## 2020-02-07 NOTE — Telephone Encounter (Signed)
Spoke to pt's wife and advised her that he can d/c the O2. She did not have the name of the DME supplier but will rtn call with this information.

## 2020-02-07 NOTE — Telephone Encounter (Signed)
Order to d/c oxygen faxed to [phone]

## 2020-02-07 NOTE — Assessment & Plan Note (Addendum)
Should continually improve week by week.  Did.  Repeat his walk test today and he was able to complete it without dropping his oxygen less than 88%.  We can discontinue his supplemental oxygen

## 2020-02-07 NOTE — Progress Notes (Signed)
    Procedures performed today:    Procedure: Real-time Ultrasound Guided injection of the right knee Device: Samsung HS60  Verbal informed consent obtained.  Time-out conducted.  Noted no overlying erythema, induration, or other signs of local infection.  Skin prepped in a sterile fashion.  Local anesthesia: Topical Ethyl chloride.  With sterile technique and under real time ultrasound guidance: 1 cc Kenalog 40, 2 cc lidocaine, 2 cc bupivacaine injected easily Completed without difficulty  Pain immediately resolved suggesting accurate placement of the medication.  Advised to call if fevers/chills, erythema, induration, drainage, or persistent bleeding.  Images permanently stored and available for review in the ultrasound unit.  Impression: Technically successful ultrasound guided injection.  Independent interpretation of notes and tests performed by another provider:   None.  Brief History, Exam, Impression, and Recommendations:    Primary osteoarthritis of right knee Davinder has had years of pain, on exam he has tenderness at the medial and lateral joint lines, he has significant lateral deviation of the tibia as is seen in end-stage medial compartment osteoarthritis. He has been taking some ibuprofen without much improvement. Today we injected his knee for symptomatic relief, he will get x-rays, home physical therapy. I do however think that he is very close to a knee arthroplasty. He is recently out of the hospital for COVID-19, so he will not be a candidate for knee arthroplasty for some time. Certainly if he does not get good relief from a steroid injection we can consider genicular nerve radiofrequency ablation or Coolief ablation.    ___________________________________________ Gwen Her. Dianah Field, M.D., ABFM., CAQSM. Primary Care and [place] Instructor of [place] of [facility] of  Medicine

## 2020-02-07 NOTE — Assessment & Plan Note (Signed)
Well controlled. Continue current regimen. Follow up in  3 months.  

## 2020-02-07 NOTE — Progress Notes (Signed)
He reports that he's doing well. He stated that he gets "winded" when going to the bathroom.  He has 5 days left on the Xarelto. Wanted to know if he will have to continue this medication.   His wife wanted to know about getting an extension of her FMLA

## 2020-02-20 ENCOUNTER — Telehealth: Payer: Self-pay

## 2020-02-20 NOTE — Telephone Encounter (Signed)
Work note in [employer]

## 2020-02-20 NOTE — Telephone Encounter (Signed)
Left pt msg advising this is available on MyChart

## 2020-02-20 NOTE — Telephone Encounter (Signed)
Tycho states he finally feels well enough to return to work. He will need a work note from 12/19/2019-02/28/2020. He has been out due to [employer]

## 2020-03-08 ENCOUNTER — Ambulatory Visit (INDEPENDENT_AMBULATORY_CARE_PROVIDER_SITE_OTHER): Payer: BC Managed Care – PPO | Admitting: Sports Medicine

## 2020-03-08 ENCOUNTER — Other Ambulatory Visit: Payer: Self-pay

## 2020-03-08 DIAGNOSIS — M1711 Unilateral primary osteoarthritis, right knee: Secondary | ICD-10-CM | POA: Diagnosis not present

## 2020-03-08 NOTE — Progress Notes (Signed)
    Procedures performed today:    None.  Independent interpretation of notes and tests performed by another provider:   None.  Brief History, Exam, Impression, and Recommendations:    Primary osteoarthritis of right knee Philip Shelton returns, he is a very pleasant 59 year old male. He does have obvious osteoarthritis, at the last visit we injected his knee, he did extremely well, his pain is almost nil, and he feels as though he can live with it. He understands he can get 4 of these a year, and if persistent discomfort or pain returns prior to 3 months then we can proceed with viscosupplementation. Certainly genicular nerve radiofrequency ablation or Coolief ablation are possibilities. Because he is recently post COVID-58 with hospitalization he will not be a candidate for knee arthroplasty for some time.    ___________________________________________ Gwen Her. Dianah Field, M.D., ABFM., CAQSM. Primary Care and [place] Instructor of [place] of [facility] of Medicine

## 2020-03-08 NOTE — Assessment & Plan Note (Signed)
Philip Shelton returns, he is a very pleasant 59 year old male. He does have obvious osteoarthritis, at the last visit we injected his knee, he did extremely well, his pain is almost nil, and he feels as though he can live with it. He understands he can get 4 of these a year, and if persistent discomfort or pain returns prior to 3 months then we can proceed with viscosupplementation. Certainly genicular nerve radiofrequency ablation or Coolief ablation are possibilities. Because he is recently post COVID-23 with hospitalization he will not be a candidate for knee arthroplasty for some time.

## 2020-05-09 ENCOUNTER — Ambulatory Visit (INDEPENDENT_AMBULATORY_CARE_PROVIDER_SITE_OTHER): Payer: BC Managed Care – PPO | Admitting: Family Medicine

## 2020-05-09 ENCOUNTER — Other Ambulatory Visit: Payer: Self-pay

## 2020-05-09 ENCOUNTER — Encounter: Payer: Self-pay | Admitting: Family Medicine

## 2020-05-09 VITALS — BP 119/65 | HR 63 | Ht 72.0 in | Wt 241.0 lb

## 2020-05-09 DIAGNOSIS — Z125 Encounter for screening for malignant neoplasm of prostate: Secondary | ICD-10-CM

## 2020-05-09 DIAGNOSIS — Z23 Encounter for immunization: Secondary | ICD-10-CM | POA: Diagnosis not present

## 2020-05-09 DIAGNOSIS — U071 COVID-19: Secondary | ICD-10-CM

## 2020-05-09 DIAGNOSIS — I10 Essential (primary) hypertension: Secondary | ICD-10-CM

## 2020-05-09 DIAGNOSIS — R7301 Impaired fasting glucose: Secondary | ICD-10-CM | POA: Diagnosis not present

## 2020-05-09 DIAGNOSIS — J1282 Pneumonia due to coronavirus disease 2019: Secondary | ICD-10-CM

## 2020-05-09 LAB — POCT GLYCOSYLATED HEMOGLOBIN (HGB A1C): Hemoglobin A1C: 5.8 % — AB (ref 4.0–5.6)

## 2020-05-09 NOTE — Progress Notes (Signed)
Established Patient Office Visit  Subjective:  Patient ID: Philip Shelton, male    DOB: 31-May-1961  Age: 59 y.o. MRN: [id number]  CC:  Chief Complaint  Patient presents with   Hypertension   ifg    HPI [name] presents for   Hypertension- Pt denies chest pain, SOB, dizziness, or heart palpitations.  Taking meds as directed w/o problems.  Denies medication side effects.    Impaired fasting glucose-no increased thirst or urination. No symptoms consistent with hypoglycemia.  History of Covid pneumonia-the last time I saw him he was still struggling with a post infectious cough and still feeling a little fatigued.  He says overall he is significantly better.  He is back to work.  He says he will notice he still gets occasionally short of breath but it is not persistent.  And occasionally he will still lose his sense of taste or smell but it always seems to be transient.  Past Medical History:  Diagnosis Date   Hypertension     History reviewed. No pertinent surgical history.  Family History  Problem Relation Age of Onset   Hypertension Mother    Hypertension Father    Breast cancer Mother     Social History   Socioeconomic History   Marital status: Married    Spouse name: Not on file   Number of children: Not on file   Years of education: Not on file   Highest education level: Not on file  Occupational History   Not on file  Tobacco Use   Smoking status: Never Smoker   Smokeless tobacco: Never Used  Substance and Sexual Activity   Alcohol use: No   Drug use: No   Sexual activity: Not on file    Comment: correction sargent @ [employer], married, 2 kids, regularly exercises, walks, weights, runs daily, 1-2 cups caffeine daily.  Other Topics Concern   Not on file  Social History Narrative   Not on file   Social Determinants of Health   Financial Resource Strain:    Difficulty of Paying Living Expenses: Not on file  Food  Insecurity:    Worried About [place] in the Last Year: Not on file   Ran Out of Food in the Last Year: Not on file  Transportation Needs:    Lack of Transportation (Medical): Not on file   Lack of Transportation (Non-Medical): Not on file  Physical Activity:    Days of Exercise per Week: Not on file   Minutes of Exercise per Session: Not on file  Stress:    Feeling of Stress : Not on file  Social Connections:    Frequency of Communication with Friends and Family: Not on file   Frequency of Social Gatherings with Friends and Family: Not on file   Attends Religious Services: Not on file   Active Member of [place] or Organizations: Not on file   Attends Archivist Meetings: Not on file   Marital Status: Not on file  Intimate Partner Violence:    Fear of Current or Ex-Partner: Not on file   Emotionally Abused: Not on file   Physically Abused: Not on file   Sexually Abused: Not on file    Outpatient Medications Prior to Visit  Medication Sig Dispense Refill   carvedilol (COREG) 25 MG tablet Take 1 tablet (25 mg total) by mouth 2 (two) times daily with a meal. 180 tablet 1   Olmesartan-amLODIPine-HCTZ 40-10-25 MG TABS Take  1 tablet by mouth daily. 90 tablet 1   rivaroxaban (XARELTO) 20 MG TABS tablet Take 1 tablet (20 mg total) by mouth daily with supper. 30 tablet 4   AMBULATORY NON FORMULARY MEDICATION Medication Name: Please discontinue patients O2 tanks. [employer] fax:[phone] 1 each 0   No facility-administered medications prior to visit.    Allergies  Allergen Reactions   Chocolate Hives and Itching    ROS Review of Systems    Objective:    Physical Exam Constitutional:      Appearance: He is well-developed.  HENT:     Head: Normocephalic and atraumatic.  Cardiovascular:     Rate and Rhythm: Normal rate and regular rhythm.     Heart sounds: Normal heart sounds.  Pulmonary:     Effort: Pulmonary effort is normal.      Breath sounds: Normal breath sounds.  Skin:    General: Skin is warm and dry.  Neurological:     Mental Status: He is alert and oriented to person, place, and time.  Psychiatric:        Behavior: Behavior normal.     BP 119/65    Pulse 63    Ht 6' (1.829 m)    Wt 241 lb (109.3 kg)    SpO2 99%    BMI 32.69 kg/m  Wt Readings from Last 3 Encounters:  05/09/20 241 lb (109.3 kg)  02/07/20 242 lb (109.8 kg)  01/17/20 236 lb (107 kg)     There are no preventive care reminders to display for this patient.  There are no preventive care reminders to display for this patient.  Lab Results  Component Value Date   TSH 1.388 05/26/2012   Lab Results  Component Value Date   WBC 5.9 01/12/2020   HGB 12.3 (L) 01/12/2020   HCT 36.8 (L) 01/12/2020   MCV 86.4 01/12/2020   PLT 199 01/12/2020   Lab Results  Component Value Date   NA 142 01/12/2020   K 4.5 01/12/2020   CO2 29 01/12/2020   GLUCOSE 103 (H) 01/12/2020   BUN 9 01/12/2020   CREATININE 0.85 01/12/2020   BILITOT 0.3 12/08/2018   ALKPHOS 78 04/21/2017   AST 13 12/08/2018   ALT 15 12/08/2018   PROT 7.3 12/08/2018   ALBUMIN 4.2 04/21/2017   CALCIUM 8.9 01/12/2020   Lab Results  Component Value Date   CHOL 107 12/08/2018   Lab Results  Component Value Date   HDL 33 (L) 12/08/2018   Lab Results  Component Value Date   LDLCALC 56 12/08/2018   Lab Results  Component Value Date   TRIG 99 12/08/2018   Lab Results  Component Value Date   CHOLHDL 3.2 12/08/2018   Lab Results  Component Value Date   HGBA1C 6.1 (H) 01/12/2020      Assessment & Plan:   Problem List Items Addressed This Visit      Cardiovascular and Mediastinum   HYPERTENSION, BENIGN    Well controlled. Continue current regimen. Follow up in  6 mo      Relevant Orders   COMPLETE METABOLIC PANEL WITH GFR   Lipid panel   PSA     Respiratory   Pneumonia due to COVID-19 virus    Much improved and back to work currently.         Endocrine   IFG (impaired fasting glucose) - Primary    A1c looks phenomenal today at 5.8 much improved from 6.1 just continue to work on  healthy diet and regular exercise and follow-up in 6 months.      Relevant Orders   POCT glycosylated hemoglobin (Hb A1C)   COMPLETE METABOLIC PANEL WITH GFR   Lipid panel   PSA    Other Visit Diagnoses    Need for immunization against influenza       Relevant Orders   Flu Vaccine QUAD 36+ mos IM (Completed)   Screening for prostate cancer       Relevant Orders   PSA     Flu shot given today.  Also discussed need for shingles vaccine he is actually to be coming back on the 13th to see one of my partners for knee injection and so encouraged him to get his for shingles vaccine at that time.  No orders of the defined types were placed in this encounter.   Follow-up: Return in about 6 months (around 11/06/2020) for Hypertension.    Beatrice Lecher, MD

## 2020-05-09 NOTE — Assessment & Plan Note (Signed)
Much improved and back to work currently.

## 2020-05-09 NOTE — Assessment & Plan Note (Signed)
Well controlled. Continue current regimen. Follow up in  6 mo  

## 2020-05-09 NOTE — Assessment & Plan Note (Signed)
A1c looks phenomenal today at 5.8 much improved from 6.1 just continue to work on healthy diet and regular exercise and follow-up in 6 months.

## 2020-05-21 ENCOUNTER — Ambulatory Visit (INDEPENDENT_AMBULATORY_CARE_PROVIDER_SITE_OTHER): Payer: BC Managed Care – PPO

## 2020-05-21 ENCOUNTER — Other Ambulatory Visit: Payer: Self-pay

## 2020-05-21 ENCOUNTER — Ambulatory Visit (INDEPENDENT_AMBULATORY_CARE_PROVIDER_SITE_OTHER): Payer: BC Managed Care – PPO | Admitting: Sports Medicine

## 2020-05-21 DIAGNOSIS — M1711 Unilateral primary osteoarthritis, right knee: Secondary | ICD-10-CM | POA: Diagnosis not present

## 2020-05-21 NOTE — Progress Notes (Signed)
    Procedures performed today:    None.  Independent interpretation of notes and tests performed by another provider:   None.  Brief History, Exam, Impression, and Recommendations:    Philip Shelton is a pleasant 59yo male who has end stage primary OA of his right knee. He has significant pain and varus deformity of his knee. His last injection was in June and provided relief until about 2 weeks ago. We reinjected the knee today. We discussed meloxicam once he is done with xarelto in about 1 month. If this injection does not provide adequate relief we have discussed the next step being a knee replacement. He will follow up in 4 weeks for reevaluation.  Marcelino Duster, MS3   ___________________________________________ Gwen Her. Dianah Field, M.D., ABFM., CAQSM. Primary Care and [place] Instructor of [place] of [facility] of Medicine

## 2020-05-21 NOTE — Assessment & Plan Note (Addendum)
Philip Shelton has end-stage right knee osteoarthritis, last injected in early June. Recurrence of pain, repeat steroid injection today, he does need a knee arthroplasty, he does have very significant varus deformity. When he returns in a month he should be off of Xarelto and we may be able to add meloxicam.

## 2020-05-22 LAB — LIPID PANEL
Cholesterol: 135 mg/dL (ref <200)
HDL: 37 mg/dL — ABNORMAL LOW (ref >=40)
LDL Cholesterol (Calc): 79 mg/dL (calc)
Non-HDL Cholesterol (Calc): 98 mg/dL (calc) (ref <130)
Total CHOL/HDL Ratio: 3.6 (calc) (ref <5.0)
Triglycerides: 102 mg/dL (ref <150)

## 2020-05-22 LAB — COMPLETE METABOLIC PANEL WITH GFR
AG Ratio: 1 (calc) (ref 1.0–2.5)
ALT: 14 U/L (ref 9–46)
AST: 14 U/L (ref 10–35)
Albumin: 4 g/dL (ref 3.6–5.1)
Alkaline phosphatase (APISO): 65 U/L (ref 35–144)
BUN: 15 mg/dL (ref 7–25)
CO2: 27 mmol/L (ref 20–32)
Calcium: 9.5 mg/dL (ref 8.6–10.3)
Chloride: 106 mmol/L (ref 98–110)
Creat: 1.08 mg/dL (ref 0.70–1.33)
GFR, Est African American: 87 mL/min/{1.73_m2} (ref >=60)
GFR, Est Non African American: 75 mL/min/{1.73_m2} (ref >=60)
Globulin: 3.9 g/dL (calc) — ABNORMAL HIGH (ref 1.9–3.7)
Glucose, Bld: 85 mg/dL (ref 65–99)
Potassium: 3.6 mmol/L (ref 3.5–5.3)
Sodium: 141 mmol/L (ref 135–146)
Total Bilirubin: 0.6 mg/dL (ref 0.2–1.2)
Total Protein: 7.9 g/dL (ref 6.1–8.1)

## 2020-05-22 LAB — PSA: PSA: 0.59 ng/mL (ref <=4.0)

## 2020-06-20 ENCOUNTER — Ambulatory Visit: Payer: BC Managed Care – PPO | Admitting: Sports Medicine

## 2020-07-16 IMAGING — DX DG KNEE COMPLETE 4+V*L*
4 series · 4 of 4 positions shown · non-contrast
Comparison: None.

CLINICAL DATA: Osteoarthritis.

EXAM:
LEFT KNEE - COMPLETE 4+ VIEW

[tunnel]
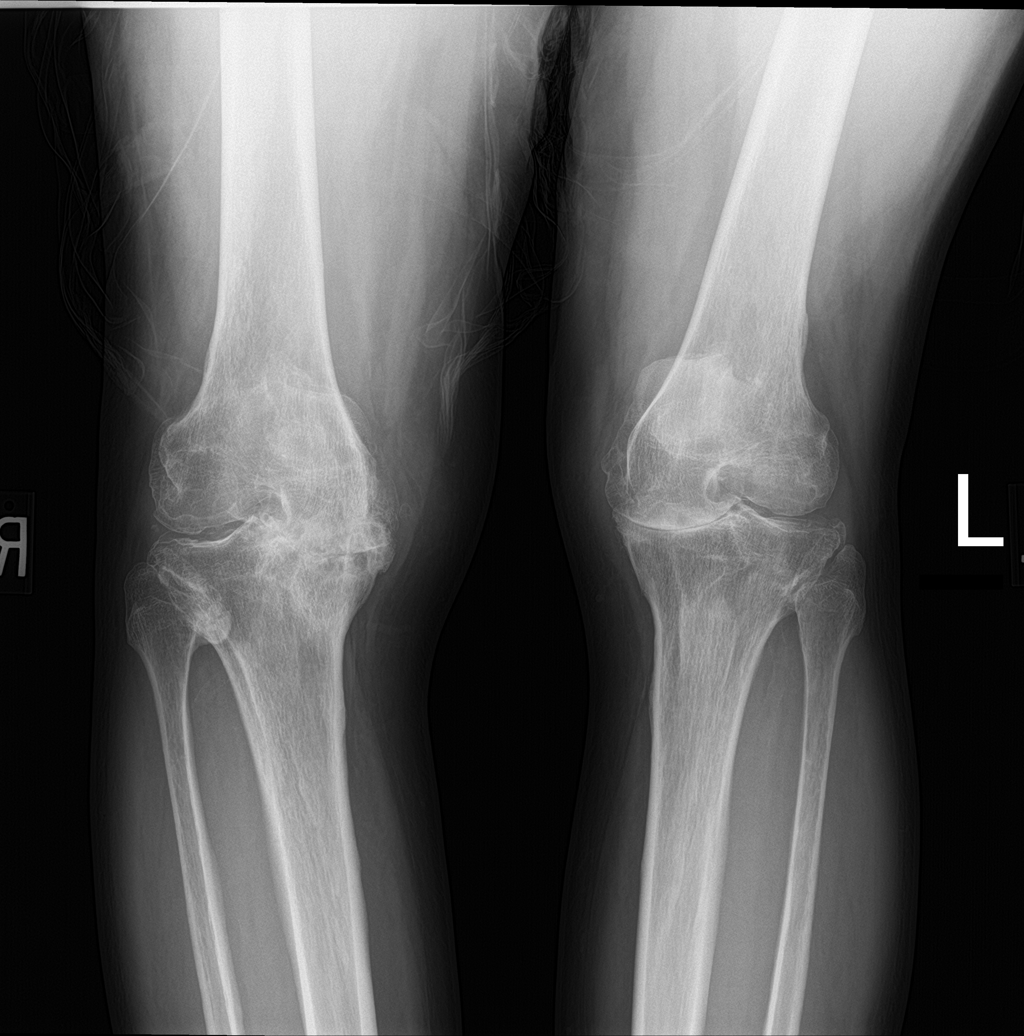

[knee lat]
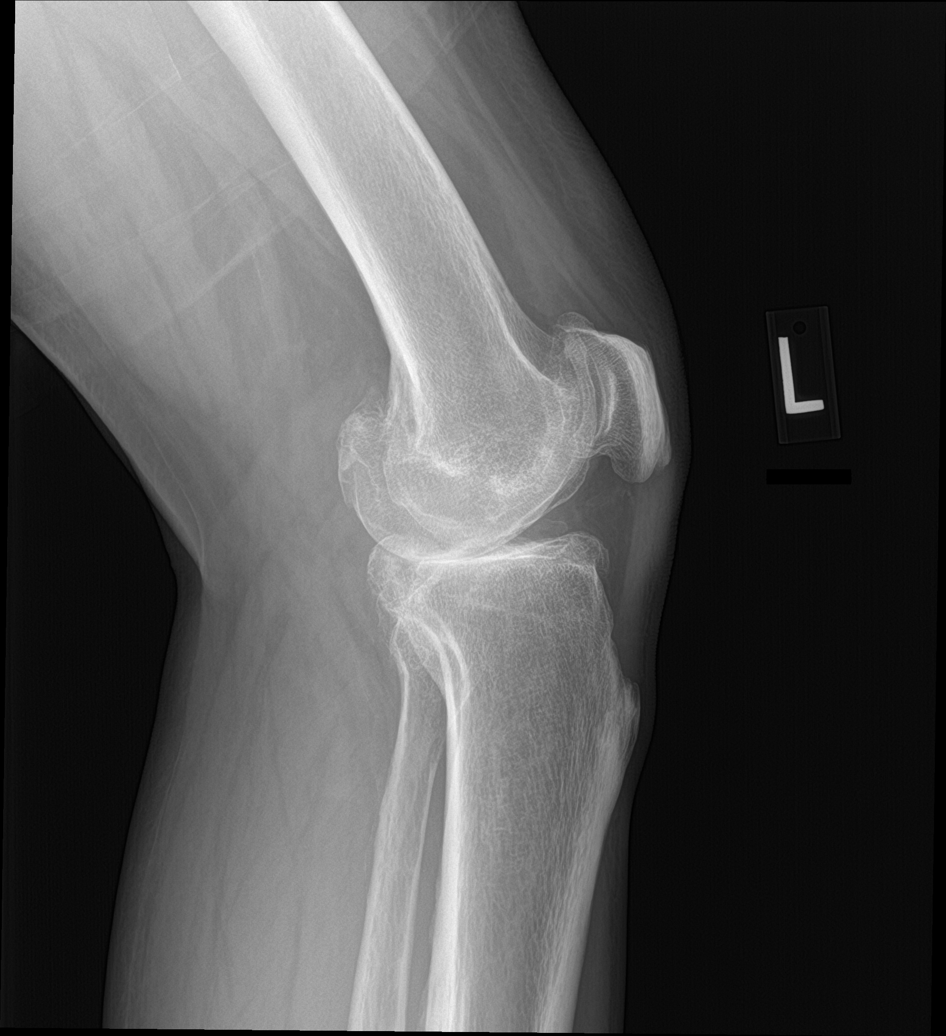

[knee sunrise]
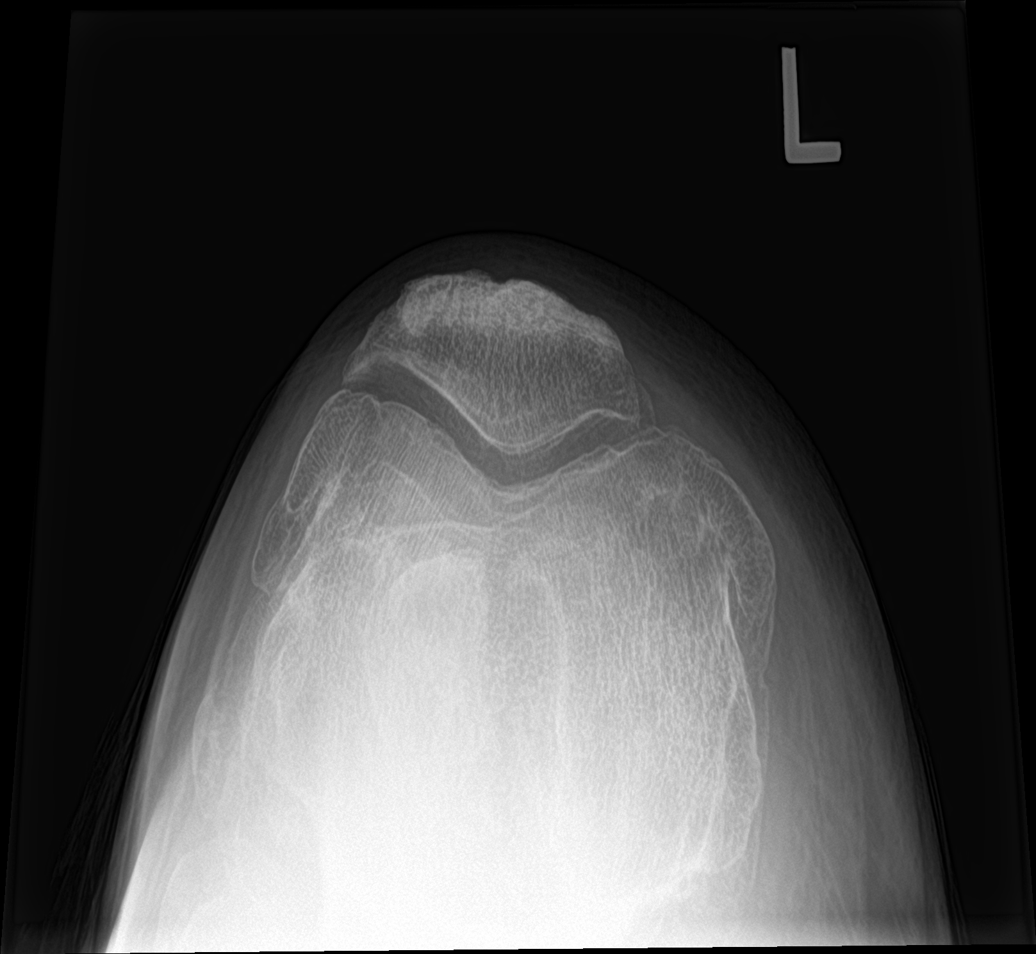

[knee ap bilat standing]
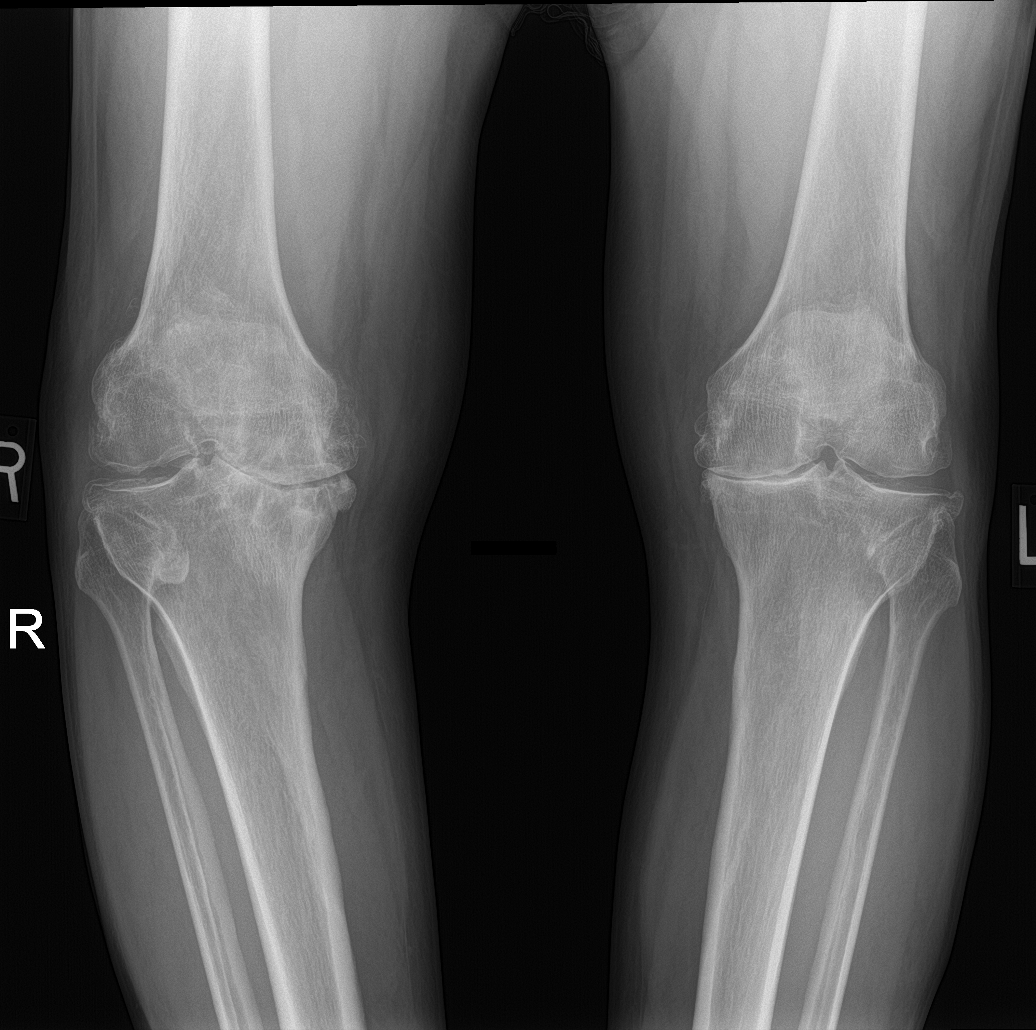

[4 of 4 positions shown; findings below may reference images not displayed]

FINDINGS: No evidence of fracture, dislocation, or joint effusion. Severe
narrowing of medial joint space is noted. Osteophyte formation is
noted laterally. Mild patellar spurring is noted. Soft tissues are
unremarkable.
IMPRESSION: Severe degenerative joint disease. No acute abnormality seen in the
left knee.

## 2020-07-16 IMAGING — DX DG KNEE COMPLETE 4+V*R*
4 series · 4 of 4 positions shown · non-contrast
Comparison: None.

CLINICAL DATA: Osteoarthritis.

EXAM:
RIGHT KNEE - COMPLETE 4+ VIEW

[tunnel]
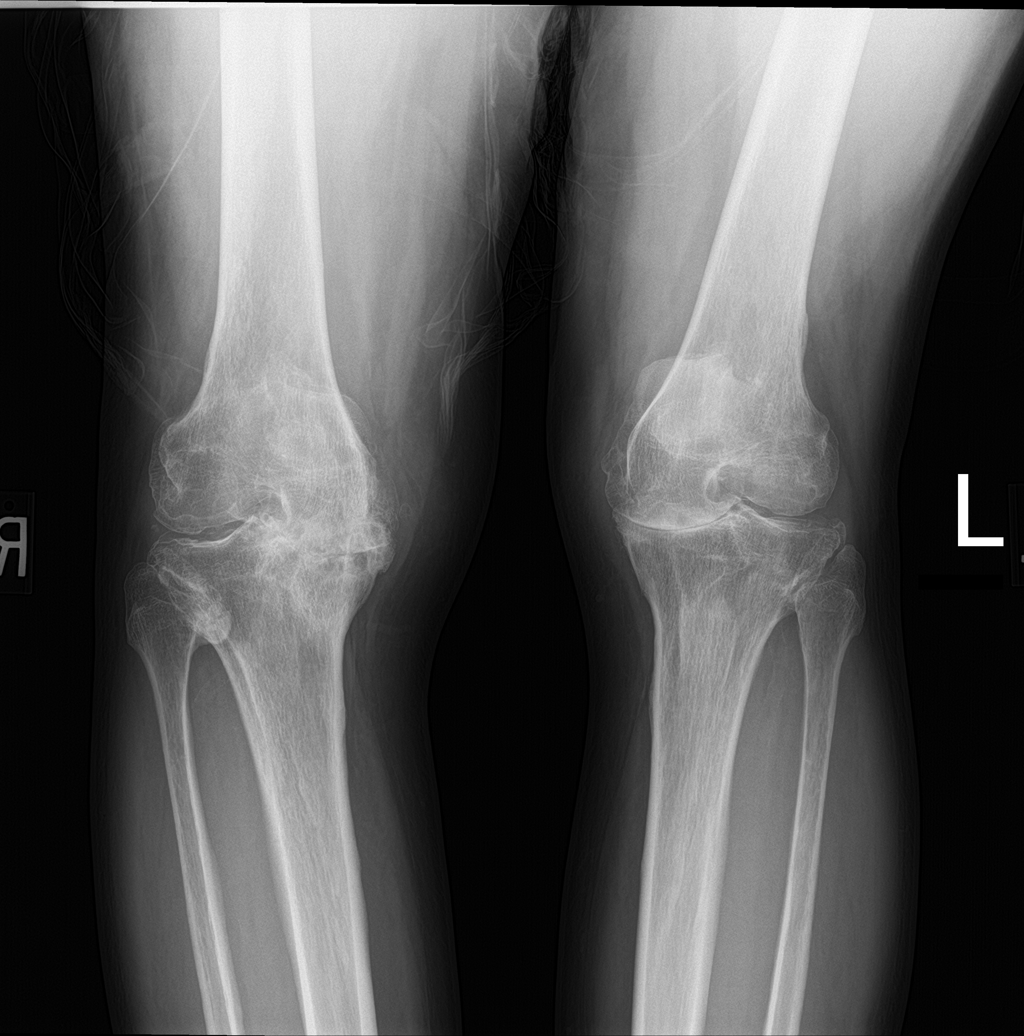

[knee lat]
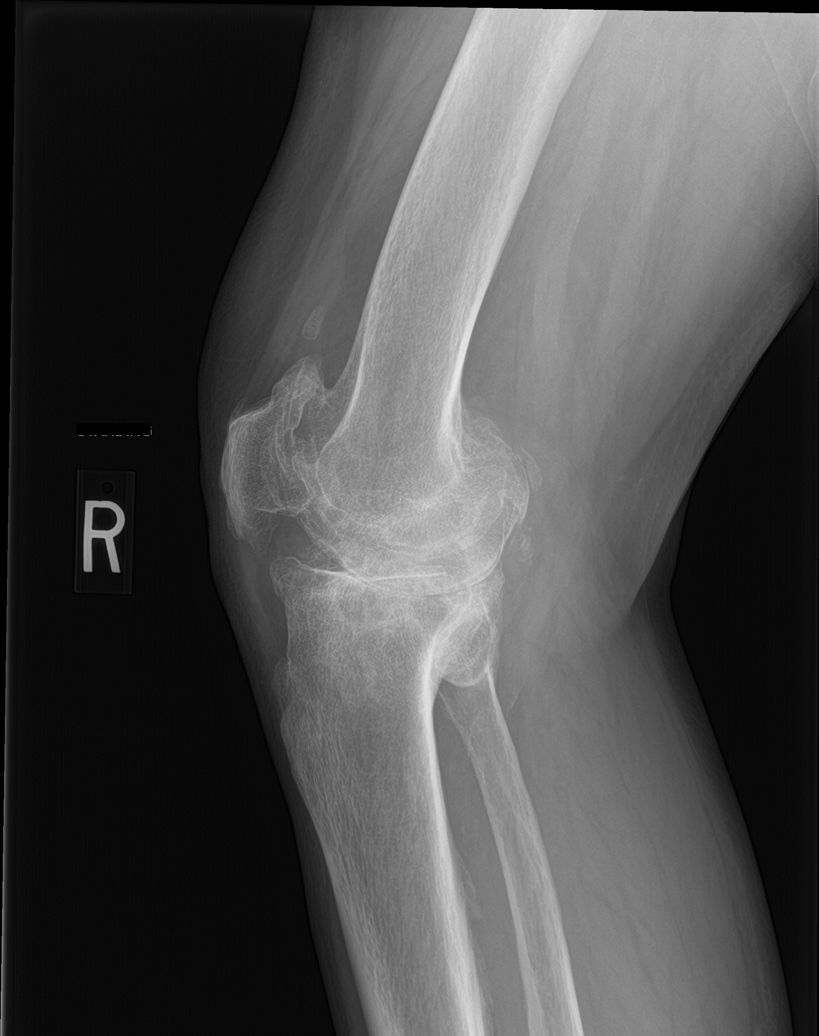

[knee sunrise]
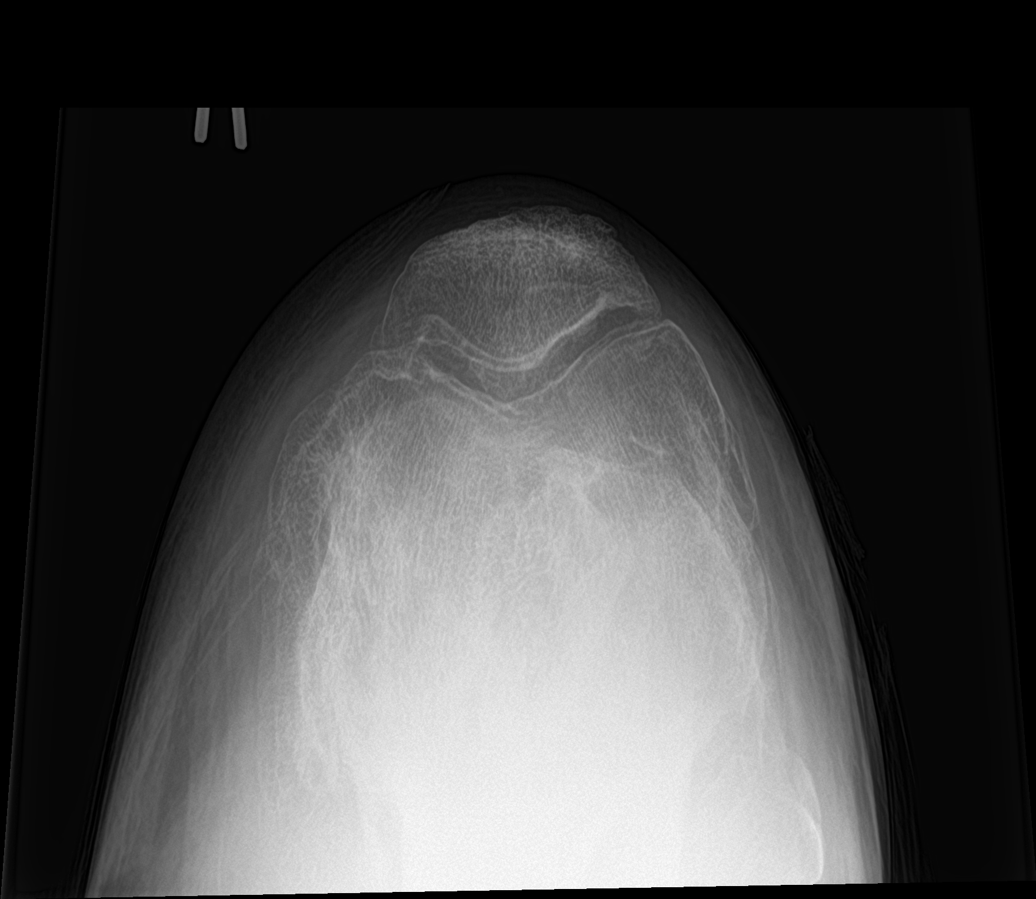

[knee ap bilat standing]
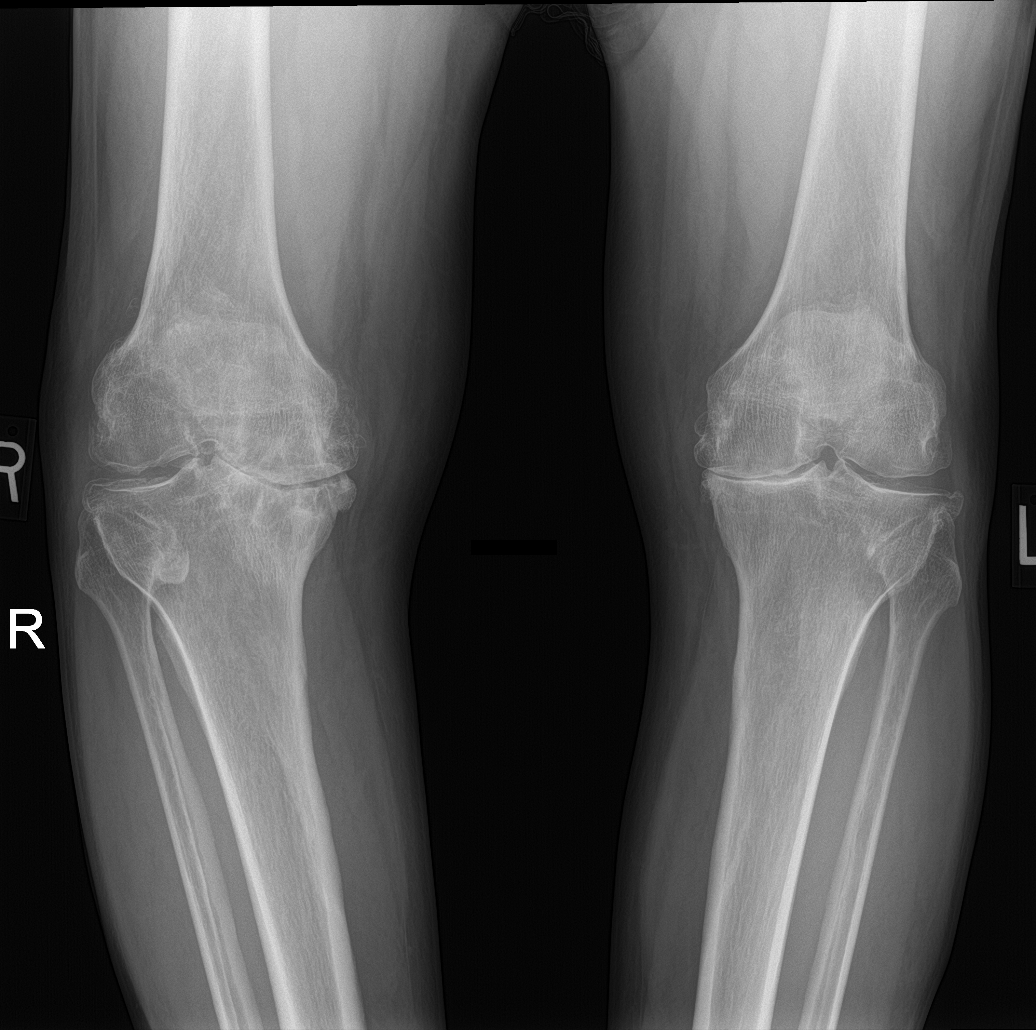

[4 of 4 positions shown; findings below may reference images not displayed]

FINDINGS: No evidence of fracture, dislocation, or joint effusion. Severe
degenerative changes noted medially. Moderate narrowing is noted
laterally. Severe osteophyte formation is seen involving
patellofemoral space. Soft tissues are unremarkable.
IMPRESSION: Severe degenerative joint disease. No acute abnormality seen in the
right knee.

## 2020-07-17 ENCOUNTER — Other Ambulatory Visit: Payer: Self-pay | Admitting: Family Medicine

## 2020-07-17 DIAGNOSIS — I10 Essential (primary) hypertension: Secondary | ICD-10-CM

## 2020-07-25 ENCOUNTER — Ambulatory Visit: Payer: BC Managed Care – PPO | Admitting: Sports Medicine

## 2020-07-25 ENCOUNTER — Other Ambulatory Visit: Payer: Self-pay | Admitting: Family Medicine

## 2020-07-25 DIAGNOSIS — I10 Essential (primary) hypertension: Secondary | ICD-10-CM

## 2020-11-09 ENCOUNTER — Ambulatory Visit: Payer: Self-pay | Admitting: Sports Medicine

## 2020-11-09 ENCOUNTER — Ambulatory Visit (INDEPENDENT_AMBULATORY_CARE_PROVIDER_SITE_OTHER): Payer: Self-pay

## 2020-11-09 ENCOUNTER — Other Ambulatory Visit: Payer: Self-pay

## 2020-11-09 DIAGNOSIS — M1711 Unilateral primary osteoarthritis, right knee: Secondary | ICD-10-CM

## 2020-11-09 MED ORDER — MELOXICAM 15 MG PO TABS: ORAL_TABLET | ORAL | 3 refills | Status: DC

## 2020-11-09 NOTE — Progress Notes (Signed)
    Procedures performed today:    Procedure: Real-time Ultrasound Guided injection of the right knee Device: Samsung HS60  Verbal informed consent obtained.  Time-out conducted.  Noted no overlying erythema, induration, or other signs of local infection.  Skin prepped in a sterile fashion.  Local anesthesia: Topical Ethyl chloride.  With sterile technique and under real time ultrasound guidance:  Noted profoundly arthritic joint, 1 cc Kenalog 40, 2 cc lidocaine, 2 cc bupivacaine injected easily Completed without difficulty  Advised to call if fevers/chills, erythema, induration, drainage, or persistent bleeding.  Images permanently stored and available for review in PACS.  Impression: Technically successful ultrasound guided injection.  Independent interpretation of notes and tests performed by another provider:   None.  Brief History, Exam, Impression, and Recommendations:    Primary osteoarthritis of right knee Recurrence of pain, needs arthroplasty, last injection in September 2021, repeat injection today. He should be off Xarelto so we can add meloxicam.    ___________________________________________ Gwen Her. Dianah Field, M.D., ABFM., CAQSM. Primary Care and [place] Instructor of [place] of [facility] of Medicine

## 2020-11-09 NOTE — Assessment & Plan Note (Signed)
Recurrence of pain, needs arthroplasty, last injection in September 2021, repeat injection today. He should be off Xarelto so we can add meloxicam.

## 2021-01-29 ENCOUNTER — Other Ambulatory Visit: Payer: Self-pay | Admitting: *Deleted

## 2021-01-29 DIAGNOSIS — I10 Essential (primary) hypertension: Secondary | ICD-10-CM

## 2021-01-29 MED ORDER — CARVEDILOL 25 MG PO TABS: 25.0000 mg | ORAL_TABLET | Freq: Two times a day (BID) | ORAL | 0 refills | Status: DC

## 2021-01-29 MED ORDER — OLMESARTAN-AMLODIPINE-HCTZ 40-10-25 MG PO TABS: 1.0000 | ORAL_TABLET | Freq: Every day | ORAL | 0 refills | Status: DC

## 2021-02-14 ENCOUNTER — Encounter: Payer: Self-pay | Admitting: Family Medicine

## 2021-02-14 ENCOUNTER — Ambulatory Visit (INDEPENDENT_AMBULATORY_CARE_PROVIDER_SITE_OTHER): Payer: BC Managed Care – PPO | Admitting: Family Medicine

## 2021-02-14 ENCOUNTER — Other Ambulatory Visit: Payer: Self-pay

## 2021-02-14 VITALS — BP 133/88 | HR 62 | Ht 72.0 in | Wt 261.0 lb

## 2021-02-14 DIAGNOSIS — Z6835 Body mass index (BMI) 35.0-35.9, adult: Secondary | ICD-10-CM | POA: Diagnosis not present

## 2021-02-14 DIAGNOSIS — R7301 Impaired fasting glucose: Secondary | ICD-10-CM

## 2021-02-14 DIAGNOSIS — I10 Essential (primary) hypertension: Secondary | ICD-10-CM

## 2021-02-14 DIAGNOSIS — Z6832 Body mass index (BMI) 32.0-32.9, adult: Secondary | ICD-10-CM | POA: Insufficient documentation

## 2021-02-14 MED ORDER — CARVEDILOL 25 MG PO TABS: 25.0000 mg | ORAL_TABLET | Freq: Two times a day (BID) | ORAL | 1 refills | Status: DC

## 2021-02-14 MED ORDER — OLMESARTAN-AMLODIPINE-HCTZ 40-10-25 MG PO TABS: 1.0000 | ORAL_TABLET | Freq: Every day | ORAL | 1 refills | Status: DC

## 2021-02-14 MED ORDER — DOXAZOSIN MESYLATE 1 MG PO TABS: 1.0000 mg | ORAL_TABLET | Freq: Every day | ORAL | 1 refills | Status: DC

## 2021-02-14 NOTE — Assessment & Plan Note (Signed)
Due for A1C. He says he lost weight initially with COVID but has put it back on.

## 2021-02-14 NOTE — Progress Notes (Signed)
Established Patient Office Visit  Subjective:  Patient ID: Philip Shelton, male    DOB: 05/31/1961  Age: 60 y.o. MRN: [id number]  CC:  Chief Complaint  Patient presents with   Hypertension    HPI Philip Shelton presents for   Hypertension- Pt denies chest pain, SOB, dizziness, or heart palpitations.  Taking meds as directed w/o problems.  Denies medication side effects.    Impaired fasting glucose-no increased thirst or urination. No symptoms consistent with hypoglycemia.   Past Medical History:  Diagnosis Date   Hypertension     No past surgical history on file.  Family History  Problem Relation Age of Onset   Hypertension Mother    Hypertension Father    Breast cancer Mother     Social History   Socioeconomic History   Marital status: Married    Spouse name: Not on file   Number of children: Not on file   Years of education: Not on file   Highest education level: Not on file  Occupational History   Not on file  Tobacco Use   Smoking status: Never   Smokeless tobacco: Never  Substance and Sexual Activity   Alcohol use: No   Drug use: No   Sexual activity: Not on file    Comment: correction sargent @ [employer], married, 2 kids, regularly exercises, walks, weights, runs daily, 1-2 cups caffeine daily.  Other Topics Concern   Not on file  Social History Narrative   Not on file   Social Determinants of Health   Financial Resource Strain: Not on file  Food Insecurity: Not on file  Transportation Needs: Not on file  Physical Activity: Not on file  Stress: Not on file  Social Connections: Not on file  Intimate Partner Violence: Not on file    Outpatient Medications Prior to Visit  Medication Sig Dispense Refill   carvedilol (COREG) 25 MG tablet Take 1 tablet (25 mg total) by mouth 2 (two) times daily with a meal. 30 tablet 0   Olmesartan-amLODIPine-HCTZ 40-10-25 MG TABS Take 1 tablet by mouth daily. 14 tablet 0   meloxicam (MOBIC) 15  MG tablet One tab PO qAM with a meal for 2 weeks, then daily prn pain. 30 tablet 3   No facility-administered medications prior to visit.    Allergies  Allergen Reactions   Chocolate Hives and Itching    ROS Review of Systems    Objective:    Physical Exam Constitutional:      Appearance: Normal appearance. He is well-developed.  HENT:     Head: Normocephalic and atraumatic.  Cardiovascular:     Rate and Rhythm: Normal rate and regular rhythm.     Heart sounds: Normal heart sounds.  Pulmonary:     Effort: Pulmonary effort is normal.     Breath sounds: Normal breath sounds.  Skin:    General: Skin is warm and dry.  Neurological:     Mental Status: He is alert and oriented to person, place, and time.  Psychiatric:        Behavior: Behavior normal.    BP 133/88   Pulse 62   Ht 6' (1.829 m)   Wt 261 lb (118.4 kg)   SpO2 97%   BMI 35.40 kg/m  Wt Readings from Last 3 Encounters:  02/14/21 261 lb (118.4 kg)  05/09/20 241 lb (109.3 kg)  02/07/20 242 lb (109.8 kg)     There are no preventive care reminders to display for  this patient.   There are no preventive care reminders to display for this patient.  Lab Results  Component Value Date   TSH 1.388 05/26/2012   Lab Results  Component Value Date   WBC 5.9 01/12/2020   HGB 12.3 (L) 01/12/2020   HCT 36.8 (L) 01/12/2020   MCV 86.4 01/12/2020   PLT 199 01/12/2020   Lab Results  Component Value Date   NA 141 05/21/2020   K 3.6 05/21/2020   CO2 27 05/21/2020   GLUCOSE 85 05/21/2020   BUN 15 05/21/2020   CREATININE 1.08 05/21/2020   BILITOT 0.6 05/21/2020   ALKPHOS 78 04/21/2017   AST 14 05/21/2020   ALT 14 05/21/2020   PROT 7.9 05/21/2020   ALBUMIN 4.2 04/21/2017   CALCIUM 9.5 05/21/2020   Lab Results  Component Value Date   CHOL 135 05/21/2020   Lab Results  Component Value Date   HDL 37 (L) 05/21/2020   Lab Results  Component Value Date   LDLCALC 79 05/21/2020   Lab Results  Component  Value Date   TRIG 102 05/21/2020   Lab Results  Component Value Date   CHOLHDL 3.6 05/21/2020   Lab Results  Component Value Date   HGBA1C 5.8 (A) 05/09/2020      Assessment & Plan:   Problem List Items Addressed This Visit       Cardiovascular and Mediastinum   HYPERTENSION, BENIGN - Primary    BP still borderline.  Will add an alpha blocker.  F/U in 6 mo. Due for labs today.         Relevant Medications   carvedilol (COREG) 25 MG tablet   Olmesartan-amLODIPine-HCTZ 40-10-25 MG TABS   doxazosin (CARDURA) 1 MG tablet   Other Relevant Orders   COMPLETE METABOLIC PANEL WITH GFR     Endocrine   IFG (impaired fasting glucose)    Due for A1C. He says he lost weight initially with COVID but has put it back on.        Relevant Orders   COMPLETE METABOLIC PANEL WITH GFR   Hemoglobin A1c     Other   Morbid obesity (HCC)    BMI of 35 with HTN and IFG.  Discussed really continuing to work on healthy diet and trying to get some of the weight back down.       BMI 35.0-35.9,adult    Meds ordered this encounter  Medications   carvedilol (COREG) 25 MG tablet    Sig: Take 1 tablet (25 mg total) by mouth 2 (two) times daily with a meal.    Dispense:  180 tablet    Refill:  1   Olmesartan-amLODIPine-HCTZ 40-10-25 MG TABS    Sig: Take 1 tablet by mouth daily.    Dispense:  90 tablet    Refill:  1   doxazosin (CARDURA) 1 MG tablet    Sig: Take 1 tablet (1 mg total) by mouth at bedtime.    Dispense:  90 tablet    Refill:  1    Follow-up: Return in about 6 months (around 08/16/2021) for Hypertension and A1C .    Beatrice Lecher, MD

## 2021-02-14 NOTE — Assessment & Plan Note (Signed)
BP still borderline.  Will add an alpha blocker.  F/U in 6 mo. Due for labs today.

## 2021-02-14 NOTE — Assessment & Plan Note (Signed)
BMI of 35 with HTN and IFG.  Discussed really continuing to work on healthy diet and trying to get some of the weight back down.

## 2021-02-15 LAB — COMPLETE METABOLIC PANEL WITH GFR
AG Ratio: 1.3 (calc) (ref 1.0–2.5)
ALT: 17 U/L (ref 9–46)
AST: 18 U/L (ref 10–35)
Albumin: 4.1 g/dL (ref 3.6–5.1)
Alkaline phosphatase (APISO): 70 U/L (ref 35–144)
BUN: 13 mg/dL (ref 7–25)
CO2: 27 mmol/L (ref 20–32)
Calcium: 9.1 mg/dL (ref 8.6–10.3)
Chloride: 106 mmol/L (ref 98–110)
Creat: 1.11 mg/dL (ref 0.70–1.33)
GFR, Est African American: 84 mL/min/{1.73_m2} (ref >=60)
GFR, Est Non African American: 72 mL/min/{1.73_m2} (ref >=60)
Globulin: 3.2 g/dL (calc) (ref 1.9–3.7)
Glucose, Bld: 95 mg/dL (ref 65–99)
Potassium: 3.6 mmol/L (ref 3.5–5.3)
Sodium: 141 mmol/L (ref 135–146)
Total Bilirubin: 0.7 mg/dL (ref 0.2–1.2)
Total Protein: 7.3 g/dL (ref 6.1–8.1)

## 2021-02-15 LAB — HEMOGLOBIN A1C
Hgb A1c MFr Bld: 6.2 % of total Hgb — ABNORMAL HIGH (ref <5.7)
Mean Plasma Glucose: 131 mg/dL
eAG (mmol/L): 7.3 mmol/L

## 2021-04-05 ENCOUNTER — Ambulatory Visit (INDEPENDENT_AMBULATORY_CARE_PROVIDER_SITE_OTHER): Payer: BC Managed Care – PPO

## 2021-04-05 ENCOUNTER — Ambulatory Visit (INDEPENDENT_AMBULATORY_CARE_PROVIDER_SITE_OTHER): Payer: BC Managed Care – PPO | Admitting: Sports Medicine

## 2021-04-05 ENCOUNTER — Other Ambulatory Visit: Payer: Self-pay

## 2021-04-05 DIAGNOSIS — M1711 Unilateral primary osteoarthritis, right knee: Secondary | ICD-10-CM

## 2021-04-05 NOTE — Assessment & Plan Note (Signed)
Philip Shelton returns, he is a pleasant 60 year old male with end-stage knee osteoarthritis, significant varus deformity and lateral subluxation of the tibia, we last injected him in March and he did well, repeat injection today per his request, I have emphasized again that he really just needs an arthroplasty.

## 2021-04-05 NOTE — Progress Notes (Signed)
    Procedures performed today:    Procedure: Real-time Ultrasound Guided injection of the right knee Device: Samsung HS60  Verbal informed consent obtained.  Time-out conducted.  Noted no overlying erythema, induration, or other signs of local infection.  Skin prepped in a sterile fashion.  Local anesthesia: Topical Ethyl chloride.  With sterile technique and under real time ultrasound guidance: Noted severely arthritic joint, 1 cc Kenalog 40, 2 cc lidocaine, 2 cc bupivacaine injected easily Completed without difficulty  Advised to call if fevers/chills, erythema, induration, drainage, or persistent bleeding.  Images permanently stored and available for review in PACS.  Impression: Technically successful ultrasound guided injection.  Independent interpretation of notes and tests performed by another provider:   None.  Brief History, Exam, Impression, and Recommendations:    Primary osteoarthritis of right knee Philip Shelton returns, Philip Shelton is a pleasant 60 year old male with end-stage knee osteoarthritis, significant varus deformity and lateral subluxation of the tibia, we last injected him in March and Philip Shelton did well, repeat injection today per his request, I have emphasized again that Philip Shelton really just needs an arthroplasty.    ___________________________________________ Gwen Her. Dianah Field, M.D., ABFM., CAQSM. Primary Care and [place] Instructor of [place] of [facility] of Medicine

## 2021-04-15 ENCOUNTER — Other Ambulatory Visit: Payer: Self-pay

## 2021-04-15 DIAGNOSIS — I10 Essential (primary) hypertension: Secondary | ICD-10-CM

## 2021-08-09 ENCOUNTER — Other Ambulatory Visit: Payer: Self-pay

## 2021-08-09 ENCOUNTER — Ambulatory Visit: Payer: BC Managed Care – PPO | Admitting: Sports Medicine

## 2021-08-09 ENCOUNTER — Ambulatory Visit (INDEPENDENT_AMBULATORY_CARE_PROVIDER_SITE_OTHER): Payer: BC Managed Care – PPO

## 2021-08-09 DIAGNOSIS — Z23 Encounter for immunization: Secondary | ICD-10-CM

## 2021-08-09 DIAGNOSIS — M1711 Unilateral primary osteoarthritis, right knee: Secondary | ICD-10-CM

## 2021-08-09 NOTE — Addendum Note (Signed)
Addended by: Gust Brooms on: 08/09/2021 03:26 PM   Modules accepted: Orders

## 2021-08-09 NOTE — Progress Notes (Signed)
    Procedures performed today:    Procedure: Real-time Ultrasound Guided injection of the right knee Device: Samsung HS60  Verbal informed consent obtained.  Time-out conducted.  Noted no overlying erythema, induration, or other signs of local infection.  Skin prepped in a sterile fashion.  Local anesthesia: Topical Ethyl chloride.  With sterile technique and under real time ultrasound guidance: Trace effusion noted, 1 cc Kenalog 40, 2 cc lidocaine, 2 cc bupivacaine injected easily Completed without difficulty  Advised to call if fevers/chills, erythema, induration, drainage, or persistent bleeding.  Images permanently stored and available for review in PACS.  Impression: Technically successful ultrasound guided injection.  Independent interpretation of notes and tests performed by another provider:   None.  Brief History, Exam, Impression, and Recommendations:    Primary osteoarthritis of right knee This is a very pleasant 60 year old male, end-stage knee osteoarthritis, significant varus deformity, lateral subluxation of the tibia, he was last injected in July of this year, repeated today due to increasing pain. He is now planning to get an arthroplasty, of note his son just got a full ride football scholarship.    ___________________________________________ Gwen Her. Dianah Field, M.D., ABFM., CAQSM. Primary Care and [place] Instructor of [place] [employer] [facility] of [employer]

## 2021-08-09 NOTE — Assessment & Plan Note (Signed)
This is a very pleasant 60 year old male, end-stage knee osteoarthritis, significant varus deformity, lateral subluxation of the tibia, he was last injected in July of this year, repeated today due to increasing pain. He is now planning to get an arthroplasty, of note his son just got a full ride football scholarship.

## 2021-08-19 ENCOUNTER — Ambulatory Visit (INDEPENDENT_AMBULATORY_CARE_PROVIDER_SITE_OTHER): Payer: BC Managed Care – PPO | Admitting: Family Medicine

## 2021-08-19 DIAGNOSIS — Z125 Encounter for screening for malignant neoplasm of prostate: Secondary | ICD-10-CM

## 2021-08-19 DIAGNOSIS — R7301 Impaired fasting glucose: Secondary | ICD-10-CM

## 2021-08-19 DIAGNOSIS — I10 Essential (primary) hypertension: Secondary | ICD-10-CM

## 2021-08-19 NOTE — Progress Notes (Signed)
No show

## 2021-08-29 ENCOUNTER — Other Ambulatory Visit: Payer: Self-pay

## 2021-08-29 ENCOUNTER — Ambulatory Visit: Payer: BC Managed Care – PPO | Admitting: Family Medicine

## 2021-08-29 ENCOUNTER — Encounter: Payer: Self-pay | Admitting: Family Medicine

## 2021-08-29 ENCOUNTER — Other Ambulatory Visit: Payer: Self-pay | Admitting: Family Medicine

## 2021-08-29 VITALS — BP 157/84 | HR 81 | Ht 72.0 in | Wt 268.0 lb

## 2021-08-29 DIAGNOSIS — Z23 Encounter for immunization: Secondary | ICD-10-CM | POA: Diagnosis not present

## 2021-08-29 DIAGNOSIS — R7301 Impaired fasting glucose: Secondary | ICD-10-CM | POA: Diagnosis not present

## 2021-08-29 DIAGNOSIS — I10 Essential (primary) hypertension: Secondary | ICD-10-CM

## 2021-08-29 LAB — POCT GLYCOSYLATED HEMOGLOBIN (HGB A1C): Hemoglobin A1C: 5.9 % — AB (ref 4.0–5.6)

## 2021-08-29 MED ORDER — OLMESARTAN-AMLODIPINE-HCTZ 40-10-25 MG PO TABS
1.0000 | ORAL_TABLET | Freq: Every day | ORAL | 1 refills | Status: DC
Start: 2021-08-29 — End: 2022-02-28

## 2021-08-29 MED ORDER — DOXAZOSIN MESYLATE 1 MG PO TABS
1.0000 mg | ORAL_TABLET | Freq: Every day | ORAL | 1 refills | Status: DC
Start: 2021-08-29 — End: 2022-03-19

## 2021-08-29 MED ORDER — CARVEDILOL 25 MG PO TABS: 25.0000 mg | ORAL_TABLET | Freq: Two times a day (BID) | ORAL | 1 refills | Status: DC

## 2021-08-29 NOTE — Assessment & Plan Note (Signed)
BP up today and has been out of his medication for whiles.  Please restart.  F/U in 6 months.

## 2021-08-29 NOTE — Assessment & Plan Note (Addendum)
Well controlled. Improved.  Continue current regimen. Follow up in  6 mo

## 2021-08-29 NOTE — Addendum Note (Signed)
Addended by: Teddy Spike on: 08/29/2021 09:44 AM   Modules accepted: Orders

## 2021-08-29 NOTE — Progress Notes (Signed)
Established Patient Office Visit  Subjective:  Patient ID: Philip Shelton, male    DOB: 1961-04-22  Age: 60 y.o. MRN: [id number]  CC:  Chief Complaint  Patient presents with   Hypertension    HPI Philip Shelton presents for   Hypertension- Pt denies chest pain, SOB, dizziness, or heart palpitations.  Taking meds as directed w/o problems.  Denies medication side effects.    Impaired fasting glucose-no increased thirst or urination. No symptoms consistent with hypoglycemia.   Past Medical History:  Diagnosis Date   Hypertension     No past surgical history on file.  Family History  Problem Relation Age of Onset   Hypertension Mother    Hypertension Father    Breast cancer Mother     Social History   Socioeconomic History   Marital status: Married    Spouse name: Not on file   Number of children: Not on file   Years of education: Not on file   Highest education level: Not on file  Occupational History   Not on file  Tobacco Use   Smoking status: Never   Smokeless tobacco: Never  Substance and Sexual Activity   Alcohol use: No   Drug use: No   Sexual activity: Not on file    Comment: correction sargent @ [employer], married, 2 kids, regularly exercises, walks, weights, runs daily, 1-2 cups caffeine daily.  Other Topics Concern   Not on file  Social History Narrative   Not on file   Social Determinants of Health   Financial Resource Strain: Not on file  Food Insecurity: Not on file  Transportation Needs: Not on file  Physical Activity: Not on file  Stress: Not on file  Social Connections: Not on file  Intimate Partner Violence: Not on file    Outpatient Medications Prior to Visit  Medication Sig Dispense Refill   meloxicam (MOBIC) 15 MG tablet Take 15 mg by mouth daily.     carvedilol (COREG) 25 MG tablet Take 1 tablet (25 mg total) by mouth 2 (two) times daily with a meal. 180 tablet 1   doxazosin (CARDURA) 1 MG tablet Take 1 tablet  (1 mg total) by mouth at bedtime. 90 tablet 1   Olmesartan-amLODIPine-HCTZ 40-10-25 MG TABS Take 1 tablet by mouth daily. 90 tablet 1   No facility-administered medications prior to visit.    Allergies  Allergen Reactions   Chocolate Hives and Itching    ROS Review of Systems    Objective:    Physical Exam Constitutional:      Appearance: Normal appearance. He is well-developed.  HENT:     Head: Normocephalic and atraumatic.  Cardiovascular:     Rate and Rhythm: Normal rate and regular rhythm.     Heart sounds: Normal heart sounds.  Pulmonary:     Effort: Pulmonary effort is normal.     Breath sounds: Normal breath sounds.  Skin:    General: Skin is warm and dry.  Neurological:     Mental Status: He is alert and oriented to person, place, and time. Mental status is at baseline.  Psychiatric:        Behavior: Behavior normal.   BP (!) 157/84    Pulse 81    Ht 6' (1.829 m)    Wt 268 lb (121.6 kg)    SpO2 99%    BMI 36.35 kg/m  Wt Readings from Last 3 Encounters:  08/29/21 268 lb (121.6 kg)  02/14/21 261 lb (  118.4 kg)  05/09/20 241 lb (109.3 kg)     Health Maintenance Due  Topic Date Due   Pneumococcal Vaccine 80-37 Years old (1 - PCV) Never done   Zoster Vaccines- Shingrix (1 of 2) Never done   COVID-19 Vaccine (4 - Booster) 11/17/2020    There are no preventive care reminders to display for this patient.  Lab Results  Component Value Date   TSH 1.388 05/26/2012   Lab Results  Component Value Date   WBC 5.9 01/12/2020   HGB 12.3 (L) 01/12/2020   HCT 36.8 (L) 01/12/2020   MCV 86.4 01/12/2020   PLT 199 01/12/2020   Lab Results  Component Value Date   NA 141 02/14/2021   K 3.6 02/14/2021   CO2 27 02/14/2021   GLUCOSE 95 02/14/2021   BUN 13 02/14/2021   CREATININE 1.11 02/14/2021   BILITOT 0.7 02/14/2021   ALKPHOS 78 04/21/2017   AST 18 02/14/2021   ALT 17 02/14/2021   PROT 7.3 02/14/2021   ALBUMIN 4.2 04/21/2017   CALCIUM 9.1 02/14/2021    Lab Results  Component Value Date   CHOL 135 05/21/2020   Lab Results  Component Value Date   HDL 37 (L) 05/21/2020   Lab Results  Component Value Date   LDLCALC 79 05/21/2020   Lab Results  Component Value Date   TRIG 102 05/21/2020   Lab Results  Component Value Date   CHOLHDL 3.6 05/21/2020   Lab Results  Component Value Date   HGBA1C 5.9 (A) 08/29/2021      Assessment & Plan:   Problem List Items Addressed This Visit       Cardiovascular and Mediastinum   HYPERTENSION, BENIGN - Primary    BP up today and has been out of his medication for whiles.  Please restart.  F/U in 6 months.        Relevant Medications   carvedilol (COREG) 25 MG tablet   doxazosin (CARDURA) 1 MG tablet   Olmesartan-amLODIPine-HCTZ 40-10-25 MG TABS   Other Relevant Orders   Lipid Panel w/reflex Direct LDL   COMPLETE METABOLIC PANEL WITH GFR   CBC     Endocrine   IFG (impaired fasting glucose)    Well controlled. Improved.  Continue current regimen. Follow up in  6 mo       Relevant Orders   POCT glycosylated hemoglobin (Hb A1C) (Completed)   Lipid Panel w/reflex Direct LDL   COMPLETE METABOLIC PANEL WITH GFR   CBC   Given Shingles vaccine today.    Meds ordered this encounter  Medications   carvedilol (COREG) 25 MG tablet    Sig: Take 1 tablet (25 mg total) by mouth 2 (two) times daily with a meal.    Dispense:  180 tablet    Refill:  1   doxazosin (CARDURA) 1 MG tablet    Sig: Take 1 tablet (1 mg total) by mouth at bedtime.    Dispense:  90 tablet    Refill:  1   Olmesartan-amLODIPine-HCTZ 40-10-25 MG TABS    Sig: Take 1 tablet by mouth daily.    Dispense:  90 tablet    Refill:  1    Follow-up: Return in about 6 months (around 02/27/2022) for Hypertension.    Beatrice Lecher, MD

## 2021-08-30 LAB — CBC
HCT: 40.6 % (ref 38.5–50.0)
Hemoglobin: 13.8 g/dL (ref 13.2–17.1)
MCH: 30.1 pg (ref 27.0–33.0)
MCHC: 34 g/dL (ref 32.0–36.0)
MCV: 88.5 fL (ref 80.0–100.0)
MPV: 11.4 fL (ref 7.5–12.5)
Platelets: 143 10*3/uL (ref 140–400)
RBC: 4.59 10*6/uL (ref 4.20–5.80)
RDW: 13.1 % (ref 11.0–15.0)
WBC: 5.8 10*3/uL (ref 3.8–10.8)

## 2021-08-30 LAB — COMPLETE METABOLIC PANEL WITH GFR
AG Ratio: 1.2 (calc) (ref 1.0–2.5)
ALT: 20 U/L (ref 9–46)
AST: 17 U/L (ref 10–35)
Albumin: 3.8 g/dL (ref 3.6–5.1)
Alkaline phosphatase (APISO): 74 U/L (ref 35–144)
BUN: 18 mg/dL (ref 7–25)
CO2: 23 mmol/L (ref 20–32)
Calcium: 8.5 mg/dL — ABNORMAL LOW (ref 8.6–10.3)
Chloride: 110 mmol/L (ref 98–110)
Creat: 1.16 mg/dL (ref 0.70–1.35)
Globulin: 3.3 g/dL (calc) (ref 1.9–3.7)
Glucose, Bld: 108 mg/dL — ABNORMAL HIGH (ref 65–99)
Potassium: 4.2 mmol/L (ref 3.5–5.3)
Sodium: 143 mmol/L (ref 135–146)
Total Bilirubin: 0.2 mg/dL (ref 0.2–1.2)
Total Protein: 7.1 g/dL (ref 6.1–8.1)
eGFR: 72 mL/min/{1.73_m2} (ref >=60)

## 2021-08-30 LAB — LIPID PANEL W/REFLEX DIRECT LDL
Cholesterol: 111 mg/dL (ref <200)
HDL: 35 mg/dL — ABNORMAL LOW (ref >=40)
LDL Cholesterol (Calc): 56 mg/dL (calc)
Non-HDL Cholesterol (Calc): 76 mg/dL (calc) (ref <130)
Total CHOL/HDL Ratio: 3.2 (calc) (ref <5.0)
Triglycerides: 116 mg/dL (ref <150)

## 2021-09-03 ENCOUNTER — Encounter: Payer: BC Managed Care – PPO | Admitting: Family Medicine

## 2021-09-03 NOTE — Progress Notes (Signed)
HI Benn, your total cholesterol and LDL look great!  Your metabolic panel is normal except calcium was just a little bit on the low end.  Not sure why it is usually normal so we will keep an eye on this.  Pleat blood count is normal as well.  No sign of anemia or infection.

## 2022-02-27 ENCOUNTER — Ambulatory Visit: Payer: BC Managed Care – PPO | Admitting: Family Medicine

## 2022-02-28 ENCOUNTER — Other Ambulatory Visit: Payer: Self-pay

## 2022-02-28 DIAGNOSIS — I10 Essential (primary) hypertension: Secondary | ICD-10-CM

## 2022-02-28 MED ORDER — OLMESARTAN-AMLODIPINE-HCTZ 40-10-25 MG PO TABS: 1.0000 | ORAL_TABLET | Freq: Every day | ORAL | 1 refills | Status: DC

## 2022-03-13 ENCOUNTER — Ambulatory Visit: Payer: Self-pay

## 2022-03-13 ENCOUNTER — Ambulatory Visit (INDEPENDENT_AMBULATORY_CARE_PROVIDER_SITE_OTHER): Payer: BC Managed Care – PPO

## 2022-03-13 ENCOUNTER — Ambulatory Visit: Payer: BC Managed Care – PPO | Admitting: Sports Medicine

## 2022-03-13 DIAGNOSIS — M1711 Unilateral primary osteoarthritis, right knee: Secondary | ICD-10-CM

## 2022-03-13 NOTE — Addendum Note (Signed)
Addended by: Silverio Decamp on: 03/13/2022 11:40 AM   Modules accepted: Orders

## 2022-03-13 NOTE — Assessment & Plan Note (Addendum)
Very pleasant 61 year old male, end-stage right knee osteoarthritis with significant varus deformity, lateral subluxation, last injection in December, repeated today. Return as needed.

## 2022-03-13 NOTE — Progress Notes (Addendum)
    Procedures performed today:    Procedure: Real-time Ultrasound Guided injection of the right knee Device: Samsung HS60  Verbal informed consent obtained.  Time-out conducted.  Noted no overlying erythema, induration, or other signs of local infection.  Skin prepped in a sterile fashion.  Local anesthesia: Topical Ethyl chloride.  With sterile technique and under real time ultrasound guidance: Mild effusion noted, 1 cc Kenalog 40, 2 cc lidocaine, 2 cc bupivacaine injected easily Completed without difficulty  Advised to call if fevers/chills, erythema, induration, drainage, or persistent bleeding.  Images permanently stored and available for review in PACS.  Impression: Technically successful ultrasound guided injection.  Independent interpretation of notes and tests performed by another provider:   None.  Brief History, Exam, Impression, and Recommendations:    Primary osteoarthritis of right knee Very pleasant 61 year old male, end-stage right knee osteoarthritis with significant varus deformity, lateral subluxation, last injection in December, repeated today. Return as needed.  Chronic process with exacerbation and pharmacologic intervention  ____________________________________________ Gwen Her. Dianah Field, M.D., ABFM., CAQSM., AME. Primary Care and Sports Medicine [facility]  Adjunct Professor of [place] of [facility] of Medicine  Risk manager

## 2022-03-19 ENCOUNTER — Encounter: Payer: Self-pay | Admitting: Family Medicine

## 2022-03-19 ENCOUNTER — Ambulatory Visit (INDEPENDENT_AMBULATORY_CARE_PROVIDER_SITE_OTHER): Payer: BC Managed Care – PPO | Admitting: Family Medicine

## 2022-03-19 VITALS — BP 118/67 | HR 59 | Ht 72.0 in | Wt 257.0 lb

## 2022-03-19 DIAGNOSIS — R7301 Impaired fasting glucose: Secondary | ICD-10-CM

## 2022-03-19 DIAGNOSIS — R55 Syncope and collapse: Secondary | ICD-10-CM | POA: Diagnosis not present

## 2022-03-19 DIAGNOSIS — R61 Generalized hyperhidrosis: Secondary | ICD-10-CM | POA: Diagnosis not present

## 2022-03-19 DIAGNOSIS — M6208 Separation of muscle (nontraumatic), other site: Secondary | ICD-10-CM

## 2022-03-19 DIAGNOSIS — I10 Essential (primary) hypertension: Secondary | ICD-10-CM | POA: Diagnosis not present

## 2022-03-19 LAB — POCT GLYCOSYLATED HEMOGLOBIN (HGB A1C): Hemoglobin A1C: 5.5 % (ref 4.0–5.6)

## 2022-03-19 NOTE — Assessment & Plan Note (Signed)
1C looks phenomenal today at 5.5.

## 2022-03-19 NOTE — Assessment & Plan Note (Signed)
Well controlled. Continue current regimen. Follow up in  6 mo  

## 2022-03-19 NOTE — Progress Notes (Signed)
Acute Office Visit  Subjective:     Patient ID: Philip Shelton, male    DOB: 08/02/1961, 61 y.o.   MRN: [id number]  No chief complaint on file.   HPI Patient is in today for 2 episodes of suddenly feeling hot sweaty and feeling like he was going to almost pass out.  The first time he was at his son's football game and without the stands.  He had not had a lot to eat that day.  He said it happened again while he was at work.  Each episode lasted about 5 minutes and then he felt fine he never completely passed out.  He has not been experiencing any chest pain or shortness of breath or swelling.  He does occasionally he will feel like his heart pounds but says that is not new its been there for years and it is not changed in any way.  He says the only thing that he has noticed is that both times he had been taking ibuprofen on an empty stomach for his right knee.  He cannot quite get another injection yet so he has been mostly relying on that.  But he does not have any abdominal pain after he takes the ibuprofen.  He denies any blood in his stool.  Does have a bulge on his abdomen that he wanted to ask me about.  Its been there for years.  ROS      Objective:    BP 118/67   Pulse (!) 59   Ht 6' (1.829 m)   Wt 257 lb (116.6 kg)   SpO2 98%   BMI 34.86 kg/m    Physical Exam Constitutional:      Appearance: He is well-developed.  HENT:     Head: Normocephalic and atraumatic.  Eyes:     Conjunctiva/sclera: Conjunctivae normal.  Neck:     Vascular: No carotid bruit.  Cardiovascular:     Rate and Rhythm: Normal rate and regular rhythm.     Heart sounds: Normal heart sounds.  Pulmonary:     Effort: Pulmonary effort is normal.     Breath sounds: Normal breath sounds.  Skin:    General: Skin is warm and dry.  Neurological:     Mental Status: He is alert and oriented to person, place, and time.  Psychiatric:        Behavior: Behavior normal.     Results for orders placed  or performed in visit on 03/19/22  POCT glycosylated hemoglobin (Hb A1C)  Result Value Ref Range   Hemoglobin A1C 5.5 4.0 - 5.6 %   HbA1c POC (<> result, manual entry)     HbA1c, POC (prediabetic range)     HbA1c, POC (controlled diabetic range)          Assessment & Plan:   Problem List Items Addressed This Visit       Cardiovascular and Mediastinum   HYPERTENSION, BENIGN    Well controlled. Continue current regimen. Follow up in  69mo        Endocrine   IFG (impaired fasting glucose) - Primary    1C looks phenomenal today at 5.5.      Relevant Orders   POCT glycosylated hemoglobin (Hb A1C) (Completed)   COMPLETE METABOLIC PANEL WITH GFR   CBC   TSH   Other Visit Diagnoses     Near syncope       Relevant Orders   COMPLETE METABOLIC PANEL WITH GFR   CBC  TSH   EKG 12-Lead (Completed)   Diaphoresis       Relevant Orders   COMPLETE METABOLIC PANEL WITH GFR   CBC   TSH   Diastasis of rectus abdominis           EKG shows rate of 54 bpm, bradycardia but sinus rhythm.  No acute ST-T wave changes.  Inverted T wave in lead III.  No old for comparison.  Near syncope-unclear etiology but has now had 2 episodes.  We discussed that it could be low blood pressure certainly a possibility and looks great today.  But if he is in a situation where it occurs again and he can check his blood pressure that would be helpful.  Consider hypoglycemia but his A1c looks great today as well.  Get labs to rule out electrolyte disturbance, anemia etc.  Consider echocardiogram and carotid Dopplers for further work-up.  No chest pain or palpitations during the episode.  Less likely to be an arrhythmia.  Diastases rectus-gave reassurance of benign nature of the bulge in the abdomen.  No orders of the defined types were placed in this encounter.   Return in about 6 months (around 09/19/2022) for Hypertension.  Beatrice Lecher, MD

## 2022-03-20 LAB — COMPLETE METABOLIC PANEL WITH GFR
AG Ratio: 1.2 (calc) (ref 1.0–2.5)
ALT: 22 U/L (ref 9–46)
AST: 15 U/L (ref 10–35)
Albumin: 4.1 g/dL (ref 3.6–5.1)
Alkaline phosphatase (APISO): 65 U/L (ref 35–144)
BUN: 20 mg/dL (ref 7–25)
CO2: 27 mmol/L (ref 20–32)
Calcium: 9.3 mg/dL (ref 8.6–10.3)
Chloride: 102 mmol/L (ref 98–110)
Creat: 1.24 mg/dL (ref 0.70–1.35)
Globulin: 3.4 g/dL (calc) (ref 1.9–3.7)
Glucose, Bld: 86 mg/dL (ref 65–99)
Potassium: 4 mmol/L (ref 3.5–5.3)
Sodium: 139 mmol/L (ref 135–146)
Total Bilirubin: 0.6 mg/dL (ref 0.2–1.2)
Total Protein: 7.5 g/dL (ref 6.1–8.1)
eGFR: 67 mL/min/{1.73_m2} (ref >=60)

## 2022-03-20 LAB — CBC
HCT: 45.3 % (ref 38.5–50.0)
Hemoglobin: 15.3 g/dL (ref 13.2–17.1)
MCH: 30.5 pg (ref 27.0–33.0)
MCHC: 33.8 g/dL (ref 32.0–36.0)
MCV: 90.4 fL (ref 80.0–100.0)
MPV: 11.2 fL (ref 7.5–12.5)
Platelets: 180 10*3/uL (ref 140–400)
RBC: 5.01 10*6/uL (ref 4.20–5.80)
RDW: 13.3 % (ref 11.0–15.0)
WBC: 6.6 10*3/uL (ref 3.8–10.8)

## 2022-03-20 LAB — TSH: TSH: 1.72 mIU/L (ref 0.40–4.50)

## 2022-03-20 NOTE — Progress Notes (Signed)
Hi Philip Shelton, blood count metabolic panel look great.  Thyroid looks great as well.  Left to give to your second shingles vaccine at some point.  You are due for the second 1.

## 2022-09-12 ENCOUNTER — Other Ambulatory Visit: Payer: Self-pay | Admitting: *Deleted

## 2022-09-12 DIAGNOSIS — I10 Essential (primary) hypertension: Secondary | ICD-10-CM

## 2022-09-12 MED ORDER — CARVEDILOL 25 MG PO TABS: 25.0000 mg | ORAL_TABLET | Freq: Two times a day (BID) | ORAL | 1 refills | Status: DC

## 2022-09-15 ENCOUNTER — Ambulatory Visit: Payer: BC Managed Care – PPO | Admitting: Sports Medicine

## 2022-09-15 ENCOUNTER — Ambulatory Visit (INDEPENDENT_AMBULATORY_CARE_PROVIDER_SITE_OTHER): Payer: BC Managed Care – PPO

## 2022-09-15 DIAGNOSIS — M1711 Unilateral primary osteoarthritis, right knee: Secondary | ICD-10-CM

## 2022-09-15 MED ORDER — TRIAMCINOLONE ACETONIDE 40 MG/ML IJ SUSP
40.0000 mg | Freq: Once | INTRAMUSCULAR | Status: AC
  Administered 2022-09-15: 40 mg via INTRAMUSCULAR

## 2022-09-15 NOTE — Addendum Note (Signed)
Addended by: Tarri Glenn A on: 09/15/2022 10:51 AM   Modules accepted: Orders

## 2022-09-15 NOTE — Assessment & Plan Note (Signed)
Known right knee osteoarthritis, end-stage with significant varus deformity, last injection was in July 2023, recurrence of pain, repeat injection today, he works as a Presenter, broadcasting, and understands he is going to need a knee arthroplasty but based on his work he will need to wait probably another 6 to 12 months before he can do this.  He will start planning, I have asked him to look up surgeons in [place] to let me know who he would like a referral to.

## 2022-09-15 NOTE — Progress Notes (Signed)
    Procedures performed today:    Procedure: Real-time Ultrasound Guided injection of the right knee Device: Samsung HS60  Verbal informed consent obtained.  Time-out conducted.  Noted no overlying erythema, induration, or other signs of local infection.  Skin prepped in a sterile fashion.  Local anesthesia: Topical Ethyl chloride.  With sterile technique and under real time ultrasound guidance: Trace effusion noted, 1 cc Kenalog 40, 2 cc lidocaine, 2 cc bupivacaine injected easily Completed without difficulty  Advised to call if fevers/chills, erythema, induration, drainage, or persistent bleeding.  Images permanently stored and available for review in PACS.  Impression: Technically successful ultrasound guided injection.  Independent interpretation of notes and tests performed by another provider:   None.  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  He will start planning, I have asked him to look up surgeons in [place] to let me know who he would like a referral to.    ____________________________________________ Gwen Her. Dianah Field, M.D., ABFM., CAQSM., AME. Primary Care and Sports Medicine [facility]  Adjunct Professor of [place] of [facility] of Medicine  Risk manager

## 2022-09-19 ENCOUNTER — Other Ambulatory Visit: Payer: Self-pay | Admitting: Family Medicine

## 2022-09-19 ENCOUNTER — Encounter: Payer: Self-pay | Admitting: Family Medicine

## 2022-09-19 ENCOUNTER — Ambulatory Visit (INDEPENDENT_AMBULATORY_CARE_PROVIDER_SITE_OTHER): Payer: BC Managed Care – PPO | Admitting: Family Medicine

## 2022-09-19 VITALS — BP 136/81 | HR 57 | Ht 72.0 in | Wt 257.0 lb

## 2022-09-19 DIAGNOSIS — Z125 Encounter for screening for malignant neoplasm of prostate: Secondary | ICD-10-CM | POA: Diagnosis not present

## 2022-09-19 DIAGNOSIS — I10 Essential (primary) hypertension: Secondary | ICD-10-CM

## 2022-09-19 DIAGNOSIS — R198 Other specified symptoms and signs involving the digestive system and abdomen: Secondary | ICD-10-CM | POA: Diagnosis not present

## 2022-09-19 DIAGNOSIS — Z23 Encounter for immunization: Secondary | ICD-10-CM

## 2022-09-19 DIAGNOSIS — R7301 Impaired fasting glucose: Secondary | ICD-10-CM

## 2022-09-19 DIAGNOSIS — Z1211 Encounter for screening for malignant neoplasm of colon: Secondary | ICD-10-CM

## 2022-09-19 LAB — POCT GLYCOSYLATED HEMOGLOBIN (HGB A1C): Hemoglobin A1C: 6.2 % — AB (ref 4.0–5.6)

## 2022-09-19 MED ORDER — OLMESARTAN-AMLODIPINE-HCTZ 40-10-25 MG PO TABS: 1.0000 | ORAL_TABLET | Freq: Every day | ORAL | 3 refills | Status: DC

## 2022-09-19 NOTE — Patient Instructions (Signed)
For the stomach growling.  See if you notice any pattern or relationship to certain foods especially things like dairy foods.  Can try probiotics such as align, or Culturelle.  These can be found over-the-counter and taken daily for the next 3 to 4 weeks to see if you feel like it is helping.   Your blood sugar is a little elevated just encouraged her to really work on cutting back on sweets and carbs and increasing your vegetable intake.

## 2022-09-19 NOTE — Assessment & Plan Note (Signed)
A1c jumped up from 5.5-6.2.  Just really encouraged him to cut back on sweets and carbs.  He does not feel like he is really made any major dietary changes.  Weight is actually stable from the summer.  Continue to work on staying active and regular exercise and plan to follow-up in 4 months.

## 2022-09-19 NOTE — Progress Notes (Signed)
Established Patient Office Visit  Subjective   Patient ID: Philip Shelton, male    DOB: 02-17-1961  Age: 62 y.o. MRN: [id number]  Chief Complaint  Patient presents with   Hypertension    HPI  6 mo f/u:  Impaired fasting glucose-no increased thirst or urination. No symptoms consistent with hypoglycemia.  Hypertension- Pt denies chest pain, SOB, dizziness, or heart palpitations.  Taking meds as directed w/o problems.  Denies medication side effects.    Also wanted to discuss stomach growing.  No pain.  It can occur before or after eating.  No change in bowel movements.  No extra gas or belching.  No recent GERD.  No increased stress.    ROS    Objective:     BP (!) 151/78   Pulse (!) 57   Ht 6' (1.829 m)   Wt 257 lb (116.6 kg)   SpO2 98%   BMI 34.86 kg/m    Physical Exam Constitutional:      Appearance: He is well-developed.  HENT:     Head: Normocephalic and atraumatic.     Right Ear: External ear normal.     Left Ear: External ear normal.  Eyes:     Conjunctiva/sclera: Conjunctivae normal.  Cardiovascular:     Rate and Rhythm: Normal rate and regular rhythm.     Heart sounds: Normal heart sounds.  Pulmonary:     Effort: Pulmonary effort is normal.     Breath sounds: Normal breath sounds.  Abdominal:     General: Bowel sounds are normal. There is no distension.     Palpations: Abdomen is soft. There is no mass.     Tenderness: There is no abdominal tenderness.     Comments: Hyperactive BS  Skin:    General: Skin is warm and dry.  Neurological:     Mental Status: He is alert and oriented to person, place, and time.  Psychiatric:        Behavior: Behavior normal.     Results for orders placed or performed in visit on 09/19/22  POCT glycosylated hemoglobin (Hb A1C)  Result Value Ref Range   Hemoglobin A1C 6.2 (A) 4.0 - 5.6 %   HbA1c POC (<> result, manual entry)     HbA1c, POC (prediabetic range)     HbA1c, POC (controlled diabetic range)         The ASCVD Risk score (Arnett DK, et al., 2019) failed to calculate for the following reasons:   The valid total cholesterol range is 130 to 320 mg/dL    Assessment & Plan:   Problem List Items Addressed This Visit       Cardiovascular and Mediastinum   HYPERTENSION, BENIGN - Primary    Initial blood pressure elevated.  But has been out of his medication for couple of days.  The timing of the appointment did not quite work out.  Will refill her for 1 year I like to see him back in 4 months and we will recheck his blood pressure and A1c.      Relevant Medications   Olmesartan-amLODIPine-HCTZ 40-10-25 MG TABS   Other Relevant Orders   POCT glycosylated hemoglobin (Hb A1C) (Completed)   PSA   Lipid Panel w/reflex Direct LDL   COMPLETE METABOLIC PANEL WITH GFR   Lipase     Endocrine   IFG (impaired fasting glucose)    A1c jumped up from 5.5-6.2.  Just really encouraged him to cut back on sweets and carbs.  He  does not feel like he is really made any major dietary changes.  Weight is actually stable from the summer.  Continue to work on staying active and regular exercise and plan to follow-up in 4 months.      Relevant Orders   POCT glycosylated hemoglobin (Hb A1C) (Completed)   PSA   Lipid Panel w/reflex Direct LDL   COMPLETE METABOLIC PANEL WITH GFR   Lipase   Other Visit Diagnoses     Screening for prostate cancer       Relevant Orders   POCT glycosylated hemoglobin (Hb A1C) (Completed)   PSA   Need for immunization against influenza       Relevant Orders   Flu Vaccine QUAD 40moIM (Fluarix, Fluzone & Alfiuria Quad PF) (Completed)   Stomach growling       Relevant Orders   Lipid Panel w/reflex Direct LDL   COMPLETE METABOLIC PANEL WITH GFR   Lipase   Screen for colon cancer       Relevant Orders   Ambulatory referral to Gastroenterology       Return in about 4 months (around 01/18/2023) for Hypertension and prediabetes .    CBeatrice Lecher MD

## 2022-09-19 NOTE — Assessment & Plan Note (Signed)
Initial blood pressure elevated.  But has been out of his medication for couple of days.  The timing of the appointment did not quite work out.  Will refill her for 1 year I like to see him back in 4 months and we will recheck his blood pressure and A1c.

## 2022-09-20 LAB — COMPLETE METABOLIC PANEL WITH GFR
AG Ratio: 1.3 (calc) (ref 1.0–2.5)
ALT: 22 U/L (ref 9–46)
AST: 14 U/L (ref 10–35)
Albumin: 4 g/dL (ref 3.6–5.1)
Alkaline phosphatase (APISO): 64 U/L (ref 35–144)
BUN: 19 mg/dL (ref 7–25)
CO2: 26 mmol/L (ref 20–32)
Calcium: 9 mg/dL (ref 8.6–10.3)
Chloride: 106 mmol/L (ref 98–110)
Creat: 1.13 mg/dL (ref 0.70–1.35)
Globulin: 3.1 g/dL (calc) (ref 1.9–3.7)
Glucose, Bld: 89 mg/dL (ref 65–99)
Potassium: 4.2 mmol/L (ref 3.5–5.3)
Sodium: 142 mmol/L (ref 135–146)
Total Bilirubin: 0.5 mg/dL (ref 0.2–1.2)
Total Protein: 7.1 g/dL (ref 6.1–8.1)
eGFR: 74 mL/min/{1.73_m2} (ref >=60)

## 2022-09-20 LAB — PSA: PSA: 0.53 ng/mL (ref <=4.00)

## 2022-09-20 LAB — LIPID PANEL W/REFLEX DIRECT LDL
Cholesterol: 111 mg/dL (ref <200)
HDL: 40 mg/dL (ref >=40)
LDL Cholesterol (Calc): 56 mg/dL (calc)
Non-HDL Cholesterol (Calc): 71 mg/dL (calc) (ref <130)
Total CHOL/HDL Ratio: 2.8 (calc) (ref <5.0)
Triglycerides: 71 mg/dL (ref <150)

## 2022-09-20 LAB — LIPASE: Lipase: 76 U/L — ABNORMAL HIGH (ref 7–60)

## 2022-09-22 NOTE — Progress Notes (Signed)
Hi Philip Shelton, lipase is up just slightly this can be from a little bit of inflammation of the pancreas.  But not in a worrisome range.  I do not think this is causing the stomach growling but again just shows little bit of inflammation.  Liver function and kidney function look great.  Prostate test is normal.  Cholesterol looks good.  Nothing specific to really explain the growling.  1 option would be to refer you to GI if the probiotic does not help.  Again just recommend try the probiotic for couple of weeks just to see if that helps balance the gut flora.

## 2022-12-25 ENCOUNTER — Other Ambulatory Visit: Payer: Self-pay | Admitting: *Deleted

## 2022-12-25 DIAGNOSIS — I10 Essential (primary) hypertension: Secondary | ICD-10-CM

## 2022-12-25 MED ORDER — DOXAZOSIN MESYLATE 1 MG PO TABS
1.0000 mg | ORAL_TABLET | Freq: Every day | ORAL | 1 refills | Status: DC
Start: 2022-12-25 — End: 2023-01-20

## 2022-12-29 ENCOUNTER — Telehealth: Payer: Self-pay | Admitting: Family Medicine

## 2022-12-29 LAB — HM COLONOSCOPY

## 2022-12-29 NOTE — Telephone Encounter (Signed)
Pt dropped ECG results and would like you to review before his appt on 01/20/2023. I have placed them in your box.

## 2023-01-20 ENCOUNTER — Ambulatory Visit (INDEPENDENT_AMBULATORY_CARE_PROVIDER_SITE_OTHER): Payer: BC Managed Care – PPO | Admitting: Family Medicine

## 2023-01-20 ENCOUNTER — Encounter: Payer: Self-pay | Admitting: Family Medicine

## 2023-01-20 VITALS — BP 119/64 | HR 53 | Ht 72.0 in | Wt 238.0 lb

## 2023-01-20 DIAGNOSIS — I2699 Other pulmonary embolism without acute cor pulmonale: Secondary | ICD-10-CM

## 2023-01-20 DIAGNOSIS — I10 Essential (primary) hypertension: Secondary | ICD-10-CM

## 2023-01-20 DIAGNOSIS — Z23 Encounter for immunization: Secondary | ICD-10-CM | POA: Diagnosis not present

## 2023-01-20 DIAGNOSIS — R7301 Impaired fasting glucose: Secondary | ICD-10-CM | POA: Diagnosis not present

## 2023-01-20 LAB — POCT GLYCOSYLATED HEMOGLOBIN (HGB A1C): Hemoglobin A1C: 5.5 % (ref 4.0–5.6)

## 2023-01-20 MED ORDER — CARVEDILOL 6.25 MG PO TABS: 6.2500 mg | ORAL_TABLET | Freq: Two times a day (BID) | ORAL | 1 refills | Status: DC

## 2023-01-20 NOTE — Assessment & Plan Note (Signed)
A1C looks great today at 5.5 even better than January.  Keep up the good work.  He is really done a fantastic job with the weight loss.  We did discuss some strategies around adding in some weight training to his cardio as he does walk regularly.  And we also discussed getting up protein intake and try to eat small amount throughout the day instead of just 1 large meal.  Lab Results  Component Value Date   HGBA1C 5.5 01/20/2023

## 2023-01-20 NOTE — Assessment & Plan Note (Addendum)
Pressure looks phenomenal today but he says he is getting really low blood pressures at home for example 100/60 or even in the 90s systolic.  And he is worried it is a little too low.  He has lost 20+ pounds so this is most likely caused a significant drop in his blood pressures.  Blood pressures have been low I am going to reduce your carvedilol from 25 mg down to 6.25.  I just wrote it for 1 month so that we could try it.  If you are still seeing a lot of blood pressures under 110 then let me know and we might be able to discontinue it completely.  But since she were on the higher strength I wanted to reduce it first and let the body reacclimate over a couple of weeks and see what pressures are getting before we stop it completely.

## 2023-01-20 NOTE — Patient Instructions (Signed)
Blood pressures have been low I am going to reduce your carvedilol from 25 mg down to 6.25.  I just wrote it for 1 month so that we could try it.  If you are still seeing a lot of blood pressures under 110 then let me know and we might be able to discontinue it completely.  But since she were on the higher strength I wanted to reduce it first and let the body reacclimate over a couple of weeks and see what pressures are getting before we stop it completely.

## 2023-01-20 NOTE — Progress Notes (Signed)
Established Patient Office Visit  Subjective   Patient ID: Philip Shelton, male    DOB: Apr 30, 1961  Age: 62 y.o. MRN: [id number]  Chief Complaint  Patient presents with   Hypertension   ifg    HPI Hypertension- Pt denies chest pain, SOB, dizziness, or heart palpitations.  Taking meds as directed w/o p  roblems.  Denies medication side effects.    Impaired fasting glucose-no increased thirst or urination. No symptoms consistent with hypoglycemia.  He said when he had his recent PE he had an abnormal heart rhythm on his rhythm strip and they did give him a copy and he dropped it off.  It does show an irregularly irregular rhythm but is not tachycardic.  Rate of 96 bpm.  He says otherwise he has not had any problems such as chest pain or palpitations or any other concerns.  He is really done great with weight loss.  He was 257 he is now down to 238 but he feels like he is plateaued and is struggling to lose weight.  He really made some major changes after we diagnosed him with prediabetes he cut out a lot of soda and has been trying to eat more healthy choices and more vegetables.    ROS    Objective:     BP 119/64   Pulse (!) 53   Ht 6' (1.829 m)   Wt 238 lb (108 kg)   SpO2 98%   BMI 32.28 kg/m    Physical Exam Constitutional:      Appearance: He is well-developed.  HENT:     Head: Normocephalic and atraumatic.  Cardiovascular:     Rate and Rhythm: Normal rate and regular rhythm.     Heart sounds: Normal heart sounds.  Pulmonary:     Effort: Pulmonary effort is normal.     Breath sounds: Normal breath sounds.  Skin:    General: Skin is warm and dry.  Neurological:     Mental Status: He is alert and oriented to person, place, and time.  Psychiatric:        Behavior: Behavior normal.      Results for orders placed or performed in visit on 01/20/23  POCT glycosylated hemoglobin (Hb A1C)  Result Value Ref Range   Hemoglobin A1C 5.5 4.0 - 5.6 %   HbA1c POC  (<> result, manual entry)     HbA1c, POC (prediabetic range)     HbA1c, POC (controlled diabetic range)        The ASCVD Risk score (Arnett DK, et al., 2019) failed to calculate for the following reasons:   The valid total cholesterol range is 130 to 320 mg/dL    Assessment & Plan:   Problem List Items Addressed This Visit       Cardiovascular and Mediastinum   PE (pulmonary thromboembolism) (HCC)   Relevant Medications   carvedilol (COREG) 6.25 MG tablet   HYPERTENSION, BENIGN - Primary    Pressure looks phenomenal today but he says he is getting really low blood pressures at home for example 100/60 or even in the 90s systolic.  And he is worried it is a little too low.  He has lost 20+ pounds so this is most likely caused a significant drop in his blood pressures.  Blood pressures have been low I am going to reduce your carvedilol from 25 mg down to 6.25.  I just wrote it for 1 month so that we could try it.  If you are  still seeing a lot of blood pressures under 110 then let me know and we might be able to discontinue it completely.  But since she were on the higher strength I wanted to reduce it first and let the body reacclimate over a couple of weeks and see what pressures are getting before we stop it completely.       Relevant Medications   carvedilol (COREG) 6.25 MG tablet     Endocrine   IFG (impaired fasting glucose)    A1C looks great today at 5.5 even better than January.  Keep up the good work.  He is really done a fantastic job with the weight loss.  We did discuss some strategies around adding in some weight training to his cardio as he does walk regularly.  And we also discussed getting up protein intake and try to eat small amount throughout the day instead of just 1 large meal.  Lab Results  Component Value Date   HGBA1C 5.5 01/20/2023        Relevant Orders   POCT glycosylated hemoglobin (Hb A1C) (Completed)   Other Visit Diagnoses     Encounter for  immunization       Relevant Orders   Varicella-zoster vaccine IM (Completed)       Return in about 6 months (around 07/23/2023) for Diabetes follow-up, Hypertension.    Nani Gasser, MD

## 2023-01-27 ENCOUNTER — Telehealth: Payer: Self-pay

## 2023-01-27 DIAGNOSIS — I10 Essential (primary) hypertension: Secondary | ICD-10-CM

## 2023-01-27 NOTE — Telephone Encounter (Signed)
[facility] pharmacy in [place] called and state the Olmesartan-amlodipine-HCTZ 40-10-25 mg is out of stock until January and wanted to know if they could change to the 40-10-12.5 mg?

## 2023-02-12 MED ORDER — OLMESARTAN-AMLODIPINE-HCTZ 40-10-12.5 MG PO TABS: 1.0000 | ORAL_TABLET | Freq: Every day | ORAL | 3 refills | Status: DC

## 2023-02-12 NOTE — Telephone Encounter (Signed)
New prescription sent to pharmacy 

## 2023-05-20 ENCOUNTER — Telehealth: Payer: Self-pay | Admitting: Family Medicine

## 2023-05-20 DIAGNOSIS — I10 Essential (primary) hypertension: Secondary | ICD-10-CM

## 2023-05-20 NOTE — Telephone Encounter (Signed)
Patient called in stating that his carvedilol (COREG) 6.25 MG tablet and Olmesartan-amLODIPine-HCTZ 40-10-12.5 MG TABS  has been discontinued per the pharmacy. Informed the patient that he had a refill left and to notify pharmacy. Patient then stated that pharmacy told him that he needed to contact the office for a new prescription. Please advise.

## 2023-05-22 MED ORDER — OLMESARTAN-AMLODIPINE-HCTZ 40-10-12.5 MG PO TABS: 1.0000 | ORAL_TABLET | Freq: Every day | ORAL | 0 refills | Status: DC

## 2023-05-22 MED ORDER — CARVEDILOL 6.25 MG PO TABS
6.2500 mg | ORAL_TABLET | Freq: Two times a day (BID) | ORAL | 1 refills | Status: DC
Start: 2023-05-22 — End: 2023-08-24

## 2023-05-22 NOTE — Telephone Encounter (Signed)
Sent 2 month supply till upcoming appointment in November

## 2023-07-23 ENCOUNTER — Ambulatory Visit: Payer: BC Managed Care – PPO | Admitting: Family Medicine

## 2023-07-23 ENCOUNTER — Encounter: Payer: Self-pay | Admitting: Family Medicine

## 2023-07-23 VITALS — BP 128/72 | HR 81 | Ht 72.0 in | Wt 243.0 lb

## 2023-07-23 DIAGNOSIS — R7301 Impaired fasting glucose: Secondary | ICD-10-CM | POA: Diagnosis not present

## 2023-07-23 DIAGNOSIS — Z23 Encounter for immunization: Secondary | ICD-10-CM | POA: Diagnosis not present

## 2023-07-23 DIAGNOSIS — I10 Essential (primary) hypertension: Secondary | ICD-10-CM

## 2023-07-23 LAB — POCT GLYCOSYLATED HEMOGLOBIN (HGB A1C): Hemoglobin A1C: 5.7 % — AB (ref 4.0–5.6)

## 2023-07-23 NOTE — Assessment & Plan Note (Signed)
Well controlled. Continue current regimen. Follow up in  6 mo  

## 2023-07-23 NOTE — Assessment & Plan Note (Signed)
A1c looks great at 5.7 though it is up slightly from last spring so just really encouraged to be mindful with food choices especially as we can to the holidays he feels like his activity level is the same.

## 2023-07-23 NOTE — Progress Notes (Signed)
   Established Patient Office Visit  Subjective   Patient ID: Philip Shelton, male    DOB: Jan 25, 1961  Age: 62 y.o. MRN: [id number]  Chief Complaint  Patient presents with   Hypertension   ifg    HPI  Hypertension- Pt denies chest pain, SOB, dizziness, or heart palpitations.  Taking meds as directed w/o problems.  Denies medication side effects.  Ports home blood pressures look fantastic typically under 130.  Impaired fasting glucose-no increased thirst or urination. No symptoms consistent with hypoglycemia.  He is still not drinking soda.     ROS    Objective:     BP 132/72   Pulse 81   Ht 6' (1.829 m)   Wt 243 lb (110.2 kg)   SpO2 100%   BMI 32.96 kg/m    Physical Exam Vitals and nursing note reviewed.  Constitutional:      Appearance: Normal appearance.  HENT:     Head: Normocephalic and atraumatic.  Eyes:     Conjunctiva/sclera: Conjunctivae normal.  Cardiovascular:     Rate and Rhythm: Normal rate and regular rhythm.  Pulmonary:     Effort: Pulmonary effort is normal.     Breath sounds: Normal breath sounds.  Skin:    General: Skin is warm and dry.  Neurological:     Mental Status: He is alert.  Psychiatric:        Mood and Affect: Mood normal.      Results for orders placed or performed in visit on 07/23/23  POCT HgB A1C  Result Value Ref Range   Hemoglobin A1C 5.7 (A) 4.0 - 5.6 %   HbA1c POC (<> result, manual entry)     HbA1c, POC (prediabetic range)     HbA1c, POC (controlled diabetic range)        The ASCVD Risk score (Arnett DK, et al., 2019) failed to calculate for the following reasons:   The valid total cholesterol range is 130 to 320 mg/dL    Assessment & Plan:   Problem List Items Addressed This Visit       Cardiovascular and Mediastinum   HYPERTENSION, BENIGN - Primary    Well controlled. Continue current regimen. Follow up in  30mo       Relevant Orders   CMP14+EGFR     Endocrine   IFG (impaired fasting glucose)     A1c looks great at 5.7 though it is up slightly from last spring so just really encouraged to be mindful with food choices especially as we can to the holidays he feels like his activity level is the same.      Relevant Orders   POCT HgB A1C (Completed)   CMP14+EGFR   Other Visit Diagnoses     Encounter for immunization       Relevant Orders   Flu vaccine trivalent PF, 30mos and older(Flulaval,Afluria,Fluarix,Fluzone) (Completed)       Return in about 6 months (around 01/20/2024) for Hypertension, Pre-diabetes.    Nani Gasser, MD

## 2023-07-24 LAB — CMP14+EGFR
ALT: 19 [IU]/L (ref 0–44)
AST: 18 [IU]/L (ref 0–40)
Albumin: 4 g/dL (ref 3.9–4.9)
Alkaline Phosphatase: 83 [IU]/L (ref 44–121)
BUN/Creatinine Ratio: 9 — ABNORMAL LOW (ref 10–24)
BUN: 11 mg/dL (ref 8–27)
Bilirubin Total: 0.5 mg/dL (ref 0.0–1.2)
CO2: 25 mmol/L (ref 20–29)
Calcium: 9.4 mg/dL (ref 8.6–10.2)
Chloride: 102 mmol/L (ref 96–106)
Creatinine, Ser: 1.21 mg/dL (ref 0.76–1.27)
Globulin, Total: 3.3 g/dL (ref 1.5–4.5)
Glucose: 99 mg/dL (ref 70–99)
Potassium: 4 mmol/L (ref 3.5–5.2)
Sodium: 142 mmol/L (ref 134–144)
Total Protein: 7.3 g/dL (ref 6.0–8.5)
eGFR: 68 mL/min/{1.73_m2} (ref >59)

## 2023-07-24 NOTE — Progress Notes (Signed)
Your lab work is within acceptable range and there are no concerning findings.   ?

## 2023-08-24 ENCOUNTER — Other Ambulatory Visit: Payer: Self-pay | Admitting: Family Medicine

## 2023-08-24 DIAGNOSIS — I10 Essential (primary) hypertension: Secondary | ICD-10-CM

## 2023-08-24 NOTE — Telephone Encounter (Signed)
Copied from CRM [id number]. Topic: Clinical - Medication Refill >> Aug 24, 2023  8:38 AM Gaetano Hawthorne wrote: Most Recent Primary Care Visit:  Provider: Nani Gasser D  Department: [facility]  Visit Type: OFFICE VISIT  Date: 07/23/2023  Medication: Olmesartan-amLODIPine-HCTZ 40-10-12.5 MG TABS  carvedilol (COREG) 6.25 MG tablet  Has the patient contacted their pharmacy? Yes, the pharmacy has mentioned that they have tried multiple times to contact the office. (Agent: If no, request that the patient contact the pharmacy for the refill. If patient does not wish to contact the pharmacy document the reason why and proceed with request.) (Agent: If yes, when and what did the pharmacy advise?)  Is this the correct pharmacy for this prescription? Yes If no, delete pharmacy and type the correct one.  This is the patient's preferred pharmacy:   [facility] STORE #[id number], [place] - [address] AT [facility] [address] [facility] [address] Phone: [phone] Fax: [phone]   Has the prescription been filled recently? No  Is the patient out of the medication? Yes  Has the patient been seen for an appointment in the last year OR does the patient have an upcoming appointment? Yes  Can we respond through MyChart? Yes  Agent: Please be advised that Rx refills may take up to 3 business days. We ask that you follow-up with your pharmacy.

## 2023-08-25 MED ORDER — CARVEDILOL 6.25 MG PO TABS: 6.2500 mg | ORAL_TABLET | Freq: Two times a day (BID) | ORAL | 1 refills | Status: DC

## 2023-08-25 MED ORDER — OLMESARTAN-AMLODIPINE-HCTZ 40-10-12.5 MG PO TABS: 1.0000 | ORAL_TABLET | Freq: Every day | ORAL | 1 refills | Status: DC

## 2023-09-14 ENCOUNTER — Other Ambulatory Visit: Payer: Self-pay | Admitting: Family Medicine

## 2023-09-14 DIAGNOSIS — I10 Essential (primary) hypertension: Secondary | ICD-10-CM

## 2024-01-20 ENCOUNTER — Ambulatory Visit (INDEPENDENT_AMBULATORY_CARE_PROVIDER_SITE_OTHER): Payer: BC Managed Care – PPO | Admitting: Family Medicine

## 2024-01-20 ENCOUNTER — Encounter: Payer: Self-pay | Admitting: Family Medicine

## 2024-01-20 VITALS — BP 126/86 | HR 67 | Ht 72.0 in | Wt 252.1 lb

## 2024-01-20 DIAGNOSIS — R7301 Impaired fasting glucose: Secondary | ICD-10-CM

## 2024-01-20 DIAGNOSIS — I10 Essential (primary) hypertension: Secondary | ICD-10-CM

## 2024-01-20 LAB — POCT GLYCOSYLATED HEMOGLOBIN (HGB A1C): Hemoglobin A1C: 5.7 % — AB (ref 4.0–5.6)

## 2024-01-20 MED ORDER — OLMESARTAN-AMLODIPINE-HCTZ 40-10-12.5 MG PO TABS: 1.0000 | ORAL_TABLET | Freq: Every day | ORAL | 1 refills | Status: DC

## 2024-01-20 NOTE — Assessment & Plan Note (Signed)
 A1c looks fantastic at 5.7.  Continue current regimen.  He is doing great.

## 2024-01-20 NOTE — Progress Notes (Signed)
 Established Patient Office Visit  Subjective  Patient ID: Shajuan Done, male    DOB: 08/11/1961  Age: 63 y.o. MRN: [id number]  Chief Complaint  Patient presents with   Hypertension   ifg    HPI  Hypertension- Pt denies chest pain, SOB, dizziness, or heart palpitations.  Taking meds as directed w/o problems.  Denies medication side effects.    Impaired fasting glucose-no increased thirst or urination. No symptoms consistent with hypoglycemia.  He says he also put some weight on in the last several months he just had a lot going on his father passed away in 2023-11-21 he lost his part-time job because they are moving employees to [place].  He says that it is not necessarily a financial stressor for him.  But he is also been helping his mom a lot more since his dad passed.  But he says he plans on really working on it and getting the weight under better control.    ROS    Objective:     BP 126/86   Pulse 67   Ht 6' (1.829 m)   Wt 252 lb 1.9 oz (114.4 kg)   SpO2 100%   BMI 34.19 kg/m    Physical Exam Vitals and nursing note reviewed.  Constitutional:      Appearance: Normal appearance.  HENT:     Head: Normocephalic and atraumatic.  Eyes:     Conjunctiva/sclera: Conjunctivae normal.  Cardiovascular:     Rate and Rhythm: Normal rate and regular rhythm.  Pulmonary:     Effort: Pulmonary effort is normal.     Breath sounds: Normal breath sounds.  Skin:    General: Skin is warm and dry.  Neurological:     Mental Status: He is alert.  Psychiatric:        Mood and Affect: Mood normal.      Results for orders placed or performed in visit on 01/20/24  POCT HgB A1C  Result Value Ref Range   Hemoglobin A1C 5.7 (A) 4.0 - 5.6 %   HbA1c POC (<> result, manual entry)     HbA1c, POC (prediabetic range)     HbA1c, POC (controlled diabetic range)        The ASCVD Risk score (Arnett DK, et al., 2019) failed to calculate for the following reasons:   The valid  total cholesterol range is 130 to 320 mg/dL    Assessment & Plan:   Problem List Items Addressed This Visit       Cardiovascular and Mediastinum   HYPERTENSION, BENIGN - Primary   He says occasionally he just does not feel right and will, get a little clammy and will check his blood pressure and it can sometimes be around 110.  He says when that happens he usually skips his blood pressure medication.  We did discuss that I would prefer that he take the Tribenzor every day and if he does need to hold a blood pressure pill to hold the carvedilol .  It may be that we actually need to decrease his dose down to 3.125.  He says he will keep track of it a little bit more continuously and let me know.  Of his every now and again or just in a great while then just encouraged him to push fluids.  And stay hydrated   well controlled. Continue current regimen. Follow up in  39mo       Relevant Medications   Olmesartan -amLODIPine -HCTZ 40-10-12.5 MG TABS   Other Relevant  Orders   CMP14+EGFR   Lipid panel   CBC     Endocrine   IFG (impaired fasting glucose)   A1c looks fantastic at 5.7.  Continue current regimen.  He is doing great.      Relevant Orders   CMP14+EGFR   Lipid panel   CBC   POCT HgB A1C (Completed)    No follow-ups on file.    Duaine German, MD

## 2024-01-20 NOTE — Assessment & Plan Note (Addendum)
 He says occasionally he just does not feel right and will, get a little clammy and will check his blood pressure and it can sometimes be around 110.  He says when that happens he usually skips his blood pressure medication.  We did discuss that I would prefer that he take the Tribenzor every day and if he does need to hold a blood pressure pill to hold the carvedilol .  It may be that we actually need to decrease his dose down to 3.125.  He says he will keep track of it a little bit more continuously and let me know.  Of his every now and again or just in a great while then just encouraged him to push fluids.  And stay hydrated   well controlled. Continue current regimen. Follow up in  42mo

## 2024-01-21 LAB — CBC
Hematocrit: 44.7 % (ref 37.5–51.0)
Hemoglobin: 15.3 g/dL (ref 13.0–17.7)
MCH: 30.5 pg (ref 26.6–33.0)
MCHC: 34.2 g/dL (ref 31.5–35.7)
MCV: 89 fL (ref 79–97)
Platelets: 167 10*3/uL (ref 150–450)
RBC: 5.02 x10E6/uL (ref 4.14–5.80)
RDW: 13 % (ref 11.6–15.4)
WBC: 5.3 10*3/uL (ref 3.4–10.8)

## 2024-01-21 LAB — CMP14+EGFR
ALT: 21 IU/L (ref 0–44)
AST: 18 IU/L (ref 0–40)
Albumin: 4 g/dL (ref 3.9–4.9)
Alkaline Phosphatase: 83 IU/L (ref 44–121)
BUN/Creatinine Ratio: 9 — ABNORMAL LOW (ref 10–24)
BUN: 11 mg/dL (ref 8–27)
Bilirubin Total: 0.6 mg/dL (ref 0.0–1.2)
CO2: 23 mmol/L (ref 20–29)
Calcium: 9.1 mg/dL (ref 8.6–10.2)
Chloride: 100 mmol/L (ref 96–106)
Creatinine, Ser: 1.2 mg/dL (ref 0.76–1.27)
Globulin, Total: 3.5 g/dL (ref 1.5–4.5)
Glucose: 100 mg/dL — ABNORMAL HIGH (ref 70–99)
Potassium: 4.1 mmol/L (ref 3.5–5.2)
Sodium: 139 mmol/L (ref 134–144)
Total Protein: 7.5 g/dL (ref 6.0–8.5)
eGFR: 68 mL/min/{1.73_m2} (ref >59)

## 2024-01-21 LAB — LIPID PANEL
Chol/HDL Ratio: 3.5 ratio (ref 0.0–5.0)
Cholesterol, Total: 127 mg/dL (ref 100–199)
HDL: 36 mg/dL — ABNORMAL LOW (ref >39)
LDL Chol Calc (NIH): 74 mg/dL (ref 0–99)
Triglycerides: 89 mg/dL (ref 0–149)
VLDL Cholesterol Cal: 17 mg/dL (ref 5–40)

## 2024-01-22 ENCOUNTER — Ambulatory Visit: Payer: Self-pay | Admitting: Family Medicine

## 2024-01-22 NOTE — Progress Notes (Signed)
 Hi [name], the metabolic panel overall looks good.  Kidney liver stable.  LDL cholesterol also looks good under 100.  Continue to work on healthy diet and regular exercise blood count looks great no sign of anemia.

## 2024-03-22 ENCOUNTER — Ambulatory Visit (INDEPENDENT_AMBULATORY_CARE_PROVIDER_SITE_OTHER): Admitting: Sports Medicine

## 2024-03-22 ENCOUNTER — Other Ambulatory Visit (INDEPENDENT_AMBULATORY_CARE_PROVIDER_SITE_OTHER)

## 2024-03-22 ENCOUNTER — Encounter: Payer: Self-pay | Admitting: Sports Medicine

## 2024-03-22 DIAGNOSIS — M1711 Unilateral primary osteoarthritis, right knee: Secondary | ICD-10-CM

## 2024-03-22 MED ORDER — TRIAMCINOLONE ACETONIDE 40 MG/ML IJ SUSP
40.0000 mg | Freq: Once | INTRAMUSCULAR | Status: AC
  Administered 2024-03-22: 40 mg via INTRAMUSCULAR

## 2024-03-22 NOTE — Assessment & Plan Note (Signed)
 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   Known right knee osteoarthritis, end-stage with significant varus deformity, last injection January 2024, we did suggest that he would need a knee arthroplasty in the past but based on his work and the understanding he would need to wait 6 to 12 months for he could go back to this he was going to start planning. Today we did another injection today, he is ready to consider arthroplasty. He understands that arthroplasty would need to be deferred 3 months after injection.

## 2024-03-22 NOTE — Progress Notes (Signed)
    Procedures performed today:    Procedure: Real-time Ultrasound Guided injection of the right knee Device: Samsung HS60  Verbal informed consent obtained.  Time-out conducted.  Noted no overlying erythema, induration, or other signs of local infection.  Skin prepped in a sterile fashion.  Local anesthesia: Topical Ethyl chloride.  With sterile technique and under real time ultrasound guidance: Mild effusion noted, 1 cc Kenalog  40, 2 cc lidocaine, 2 cc bupivacaine injected easily Completed without difficulty  Advised to call if fevers/chills, erythema, induration, drainage, or persistent bleeding.  Images permanently stored and available for review in PACS.  Impression: Technically successful ultrasound guided injection.  Independent interpretation of notes and tests performed by another provider:   None.  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   Known right knee osteoarthritis, end-stage with significant varus deformity, last injection January 2024, we did suggest that he would need a knee arthroplasty in the past but based on his work and the understanding he would need to wait 6 to 12 months for he could go back to this he was going to start planning. Today we did another injection today, he is ready to consider arthroplasty. He understands that arthroplasty would need to be deferred 3 months after injection.    ____________________________________________ Debby PARAS. Curtis, M.D., ABFM., CAQSM., AME. Primary Care and Sports Medicine [facility]  Adjunct Professor of [facility] Medicine  [employer]  Restaurant manager, fast food

## 2024-04-15 ENCOUNTER — Other Ambulatory Visit: Payer: Self-pay | Admitting: Family Medicine

## 2024-04-19 ENCOUNTER — Telehealth: Payer: Self-pay | Admitting: Sports Medicine

## 2024-04-19 NOTE — Telephone Encounter (Signed)
 Copied from CRM [id number]. Topic: Referral - Question >> Apr 19, 2024  4:30 PM Adrianna P wrote: Reason for CRM: dr. yvone said a referral was never sent to them. Please resend

## 2024-04-25 ENCOUNTER — Ambulatory Visit: Admitting: Sports Medicine

## 2024-04-25 ENCOUNTER — Encounter: Payer: Self-pay | Admitting: Sports Medicine

## 2024-04-25 ENCOUNTER — Other Ambulatory Visit (INDEPENDENT_AMBULATORY_CARE_PROVIDER_SITE_OTHER)

## 2024-04-25 DIAGNOSIS — M170 Bilateral primary osteoarthritis of knee: Secondary | ICD-10-CM | POA: Diagnosis not present

## 2024-04-25 MED ORDER — TRIAMCINOLONE ACETONIDE 40 MG/ML IJ SUSP
40.0000 mg | Freq: Once | INTRAMUSCULAR | Status: AC
Start: 2024-04-25 — End: 2024-04-25
  Administered 2024-04-25: 40 mg via INTRA_ARTICULAR

## 2024-04-25 NOTE — Assessment & Plan Note (Signed)
 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 He is ready to consider arthroplasty. Today we did a left knee injection. Return to see me as needed. Has not yet seen Dr. Yvone.

## 2024-04-25 NOTE — Progress Notes (Signed)
    Procedures performed today:    Procedure: Real-time Ultrasound Guided injection of the left knee Device: Samsung HS60  Verbal informed consent obtained.  Time-out conducted.  Noted no overlying erythema, induration, or other signs of local infection.  Skin prepped in a sterile fashion.  Local anesthesia: Topical Ethyl chloride.  With sterile technique and under real time ultrasound guidance: Mild effusion, 1 cc Kenalog  40, 2 cc lidocaine, 2 cc bupivacaine injected easily Completed without difficulty  Advised to call if fevers/chills, erythema, induration, drainage, or persistent bleeding.  Images permanently stored and available for review in PACS.  Impression: Technically successful ultrasound guided injection.  Independent interpretation of notes and tests performed by another provider:   None.  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 He is ready to consider arthroplasty. Today we did a left knee injection. Return to see me as needed. Has not yet seen Dr. Yvone.    ____________________________________________ Debby PARAS. Curtis, M.D., ABFM., CAQSM., AME. Primary Care and Sports Medicine [facility]  Adjunct Professor of [facility] Medicine  [employer]  Restaurant manager, fast food

## 2024-05-03 ENCOUNTER — Telehealth: Payer: Self-pay

## 2024-05-03 NOTE — Telephone Encounter (Signed)
 Patient came into office to drop off preoperative form to be completed by PCP, form placed in Dr. Vernida box, thanks.

## 2024-05-04 NOTE — Telephone Encounter (Signed)
Form completed and placed in [place] [facility] basket.

## 2024-05-10 ENCOUNTER — Encounter: Payer: Self-pay | Admitting: Sports Medicine

## 2024-05-25 NOTE — Telephone Encounter (Signed)
 Form was faxed to [facility] orthopedic

## 2024-07-20 ENCOUNTER — Encounter: Payer: Self-pay | Admitting: Family Medicine

## 2024-07-20 ENCOUNTER — Ambulatory Visit: Admitting: Family Medicine

## 2024-07-20 VITALS — BP 115/77 | HR 77 | Ht 72.0 in | Wt 244.0 lb

## 2024-07-20 DIAGNOSIS — Z5189 Encounter for other specified aftercare: Secondary | ICD-10-CM

## 2024-07-20 DIAGNOSIS — R7301 Impaired fasting glucose: Secondary | ICD-10-CM | POA: Diagnosis not present

## 2024-07-20 DIAGNOSIS — I4891 Unspecified atrial fibrillation: Secondary | ICD-10-CM | POA: Insufficient documentation

## 2024-07-20 DIAGNOSIS — I10 Essential (primary) hypertension: Secondary | ICD-10-CM | POA: Diagnosis not present

## 2024-07-20 LAB — POCT GLYCOSYLATED HEMOGLOBIN (HGB A1C): Hemoglobin A1C: 6 % — AB (ref 4.0–5.6)

## 2024-07-20 MED ORDER — OLMESARTAN-AMLODIPINE-HCTZ 20-5-12.5 MG PO TABS: 1.0000 | ORAL_TABLET | Freq: Every day | ORAL | 2 refills | Status: AC

## 2024-07-20 NOTE — Progress Notes (Signed)
 Established Patient Office Visit  Patient ID: Philip Shelton, male    DOB: 30-Mar-1961  Age: 63 y.o. MRN: [id number] PCP: Alvan Dorothyann BIRCH, MD  Chief Complaint  Patient presents with   Hospitalization Follow-up    Subjective:     HPI  Discussed the use of AI scribe software for clinical note transcription with the patient, who gave verbal consent to proceed.  History of Present Illness Philip Shelton is a 63 year old male who presents for a hospital follow-up after treatment for a perforated duodenal ulcer.  Abdominal pain and perforated duodenal ulcer - Hospitalized on November 3rd for acute abdominal pain, nausea, and vomiting - CT abdomen revealed pneumoperitoneum secondary to perforated duodenal bulb ulcer - Underwent robotic laparoscopic duodenal repair on November 3rd - Discharged home the same day - Prior to ulcer, experienced constant stomach growling, initially managed with probiotics and acid medicine; symptoms recurred after stopping probiotics - Currently tolerating a soft diet (mashed potatoes, sweet potatoes, scrambled eggs) and inquiring about transition to solid foods - No fever - Normal bowel movements  Surgical site drainage - Drainage from surgical site described as yellowish-pink  Atrial fibrillation and anticoagulation - History of atrial fibrillation - Started on Eliquis for abnormal heart rhythm - Expresses concern regarding cost of Eliquis and seeks more affordable alternatives - No chest pain, shortness of breath, or palpitations - Heart muscle and walls reportedly in good condition, but persistent abnormal rhythm  Blood pressure management - Currently taking carvedilol  and olmesartan /amlodipine  combination for hypertension - Recent Shelton blood pressure readings, possibly related to weight loss and dietary changes     ROS    Objective:     BP 115/77   Pulse 77   Ht 6' (1.829 m)   Wt 244 lb (110.7 kg)   SpO2 100%   BMI 33.09  kg/m    Physical Exam Vitals and nursing note reviewed.  Constitutional:      Appearance: Normal appearance.  HENT:     Head: Normocephalic and atraumatic.  Eyes:     Conjunctiva/sclera: Conjunctivae normal.  Cardiovascular:     Rate and Rhythm: Normal rate. Rhythm irregular.  Pulmonary:     Effort: Pulmonary effort is normal.     Breath sounds: Normal breath sounds.  Skin:    General: Skin is warm and dry.     Comments: Incision is clean and healing well. Some serous drainage in the middle of the incision.  Some blood tinged.  No pus. No erythema  Neurological:     Mental Status: He is alert.  Psychiatric:        Mood and Affect: Mood normal.      Results for orders placed or performed in visit on 07/20/24  POCT HgB A1C  Result Value Ref Range   Hemoglobin A1C 6.0 (A) 4.0 - 5.6 %   HbA1c POC (<> result, manual entry)     HbA1c, POC (prediabetic range)     HbA1c, POC (controlled diabetic range)        The ASCVD Risk score (Arnett DK, et al., 2019) failed to calculate for the following reasons:   The valid total cholesterol range is 130 to 320 mg/dL    Assessment & Plan:   Problem List Items Addressed This Visit       Cardiovascular and Mediastinum   HYPERTENSION, BENIGN - Primary   Decrease strength of BP pill.  Hydrate well.       Relevant Medications   apixaban (ELIQUIS) 5 MG  TABS tablet   Olmesartan -amLODIPine -HCTZ 20-5-12.5 MG TABS   Atrial fibrillation with RVR (HCC)   Relevant Medications   apixaban (ELIQUIS) 5 MG TABS tablet   Olmesartan -amLODIPine -HCTZ 20-5-12.5 MG TABS     Endocrine   IFG (impaired fasting glucose)   Lab Results  Component Value Date   HGBA1C 6.0 (A) 07/20/2024   Looks good. F/u in 6 months      Relevant Orders   POCT HgB A1C (Completed)   Other Visit Diagnoses       Visit for wound check           Assessment and Plan Assessment & Plan Perforated duodenal ulcer, status post surgical repair Status post robotic  laparoscopic surgery on November 3rd. Discussed NSAID use and dietary factors as potential causes. Emphasized avoiding NSAIDs and certain foods. Explained acid suppression's role in healing ulcers, with studies showing improvement in six weeks, though longer duration may be needed due to perforation. - Continue Protonix  40 mg daily. - Avoid NSAIDs such as ibuprofen  and Aleve. - Maintain liquid diet until follow-up with surgeon on Monday. - Avoid carbonated beverages, caffeine, and acidic foods.  Postoperative abdominal wound drainage Postoperative drainage described as pinkish-yellow and clear, indicating no infection. Discussed importance of drainage to prevent seroma formation. Advised on signs of infection such as change in color, odor, or increased redness. - Continue wearing compression bandage to manage drainage. - Monitor for signs of infection such as change in color, odor, or increased redness. - Contact surgeon if concerns about wound healing arise.  Atrial fibrillation on anticoagulation Atrial fibrillation managed with Eliquis to reduce stroke risk. Explained Eliquis reduces stroke risk by 30-40% and is chosen for its lower bleeding risk. Discussed potential for AFib to resolve spontaneously or require further intervention. Advised on lifestyle modifications to reduce AFib triggers. - Continue Eliquis as prescribed. - Avoid caffeine and carbonated beverages. - Monitor blood pressure and adjust medication as needed. - Consulted clinical pharmacist for potential cost-saving options for Eliquis.  Hypertension Managed with carvedilol  and olmesartan /amlodipine . Recent weight loss and dietary changes may contribute to lower blood pressure readings. Discussed potential need to adjust medication dosage due to Shelton blood pressure readings. - Decreased dose of olmesartan /amlodipine /hct. - Monitor blood pressure regularly. - Ensure adequate hydration.    Return in about 6 months (around  01/17/2025) for Hypertension.    Dorothyann Byars, MD [facility] at [facility]

## 2024-07-20 NOTE — Assessment & Plan Note (Signed)
 Decrease strength of BP pill.  Hydrate well.

## 2024-07-20 NOTE — Patient Instructions (Signed)
 If BP stays low on new BP dose then please let me know.

## 2024-07-20 NOTE — Assessment & Plan Note (Signed)
 Lab Results  Component Value Date   HGBA1C 6.0 (A) 07/20/2024   Looks good. F/u in 6 months

## 2024-07-25 ENCOUNTER — Ambulatory Visit: Admitting: Family Medicine

## 2024-08-08 ENCOUNTER — Telehealth: Payer: Self-pay | Admitting: Family Medicine

## 2024-08-08 DIAGNOSIS — I10 Essential (primary) hypertension: Secondary | ICD-10-CM

## 2024-08-08 DIAGNOSIS — I4891 Unspecified atrial fibrillation: Secondary | ICD-10-CM

## 2024-08-08 DIAGNOSIS — K251 Acute gastric ulcer with perforation: Secondary | ICD-10-CM

## 2024-08-08 NOTE — Telephone Encounter (Unsigned)
 Copied from CRM [id number]. Topic: Clinical - Medication Refill >> Aug 08, 2024  9:13 AM Selinda RAMAN wrote: Medication: carvedilol  (COREG ) 6.25 MG tablet, apixaban (ELIQUIS) 5 MG TABS tablet, pantoprazole  (PROTONIX ) 40 MG tablet  Has the patient contacted their pharmacy? Yes   This is the patient's preferred pharmacy:  [facility] ST[facility] #[id number] [place], [place] - [address] AT [facility] [address] [facility] [address] Phone: [phone] Fax: [phone]  Is this the correct pharmacy for this prescription? Yes If no, delete pharmacy and type the correct one.   Has the prescription been filled recently? No  Is the patient out of the medication? No he still has a couple of days left on all these medications  Has the patient been seen for an appointment in the last year OR does the patient have an upcoming appointment? Yes  Can we respond through MyChart? No he would prefer a call or text  Please assist patient further

## 2024-08-09 MED ORDER — CARVEDILOL 6.25 MG PO TABS: 6.2500 mg | ORAL_TABLET | Freq: Two times a day (BID) | ORAL | 1 refills | Status: AC

## 2024-08-09 MED ORDER — APIXABAN 5 MG PO TABS: 5.0000 mg | ORAL_TABLET | Freq: Two times a day (BID) | ORAL | 0 refills | Status: AC

## 2024-08-09 MED ORDER — PANTOPRAZOLE SODIUM 40 MG PO TBEC: 40.0000 mg | DELAYED_RELEASE_TABLET | Freq: Every day | ORAL | 1 refills | Status: AC

## 2024-08-09 NOTE — Telephone Encounter (Signed)
 Requesting rx rf of Apixaban  5mg , carvedilol  6.25mg ,and protonix  40mg  Last written   Apixaban  5mg  last shows historical provider  Carvedilol  6.25mg  last written 09/14/2023 Pantoprazole  40mg  last written 11/07/225   Last  OV 07/20/2024 Upcoming appt 01/17/2025

## 2024-08-09 NOTE — Telephone Encounter (Signed)
 Meds ordered this encounter  Medications   carvedilol  (COREG ) 6.25 MG tablet    Sig: Take 1 tablet (6.25 mg total) by mouth 2 (two) times daily with a meal.    Dispense:  180 tablet    Refill:  1   pantoprazole  (PROTONIX ) 40 MG tablet    Sig: Take 1 tablet (40 mg total) by mouth daily.    Dispense:  90 tablet    Refill:  1   apixaban  (ELIQUIS ) 5 MG TABS tablet    Sig: Take 1 tablet (5 mg total) by mouth 2 (two) times daily.    Dispense:  180 tablet    Refill:  0

## 2024-09-29 ENCOUNTER — Other Ambulatory Visit (HOSPITAL_COMMUNITY): Payer: Self-pay

## 2024-09-29 ENCOUNTER — Telehealth: Payer: Self-pay

## 2024-09-29 NOTE — Telephone Encounter (Signed)
 Pharmacy Patient Advocate Encounter   Received notification from Physician's Office that prior authorization for eliquis  is required/requested.   Insurance verification completed.   The patient is insured through [employer] [facility].   Per test claim: Refill too soon. PA is not needed at this time. Medication was filled 09/22/24. Next eligible fill date is 11/29/24.

## 2024-09-29 NOTE — Telephone Encounter (Unsigned)
 Copied from CRM #[id number]. Topic: Clinical - Prescription Issue >> Sep 29, 2024  2:59 PM Ivette P wrote: Reason for CRM: PT called in because the medication apixaban  (ELIQUIS ) 5 MG TABS tablet is being given to him at pharmacy for $200    Pt can't pay that would like to know if theres a generic brand, because pt can't afford the medications.   [phone]- leave message or call.

## 2024-09-29 NOTE — Telephone Encounter (Signed)
 Patient called office informing that the following medication apixaban  (ELIQUIS ) 5 MG TABS tablet [[id number]] is $200 out of pocket, and patient would like to know if there is something else he could get, please advise, thanks.

## 2024-09-30 NOTE — Telephone Encounter (Signed)
Please notify patient of the below information.

## 2024-10-01 ENCOUNTER — Telehealth: Payer: Self-pay | Admitting: Family Medicine

## 2024-10-01 DIAGNOSIS — Z01810 Encounter for preprocedural cardiovascular examination: Secondary | ICD-10-CM

## 2024-10-01 DIAGNOSIS — I4891 Unspecified atrial fibrillation: Secondary | ICD-10-CM

## 2024-10-01 NOTE — Telephone Encounter (Signed)
 Please call pt: we received for preop-   We need to schedule him for pre-op visit and will also need cardiology [place].  Does he have a Cardiologist that he works with? If not then we will have to place a referral.

## 2024-10-04 NOTE — Telephone Encounter (Signed)
 Orders Placed This Encounter  Procedures   Ambulatory referral to Cardiology    Referral Priority:   Routine    Referral Type:   Consultation    Referral Reason:   Specialty Services Required    Number of Visits Requested:   1   AMB Referral VBCI Care Management    Referral Priority:   Routine    Referral Type:   Consultation    Referral Reason:   Care Coordination    Number of Visits Requested:   1

## 2024-10-04 NOTE — Telephone Encounter (Signed)
 Spoke with patient. He does not have a cardiologist currently . He was transferred to front desk for scheduling pre-op physical with Dr. Alvan  And will need a referral to cardiology  He also states taht the Eliquis  cost is $200 and he can not afford this - would like to know if Dr. Alvan could recommend something cheaper that he could use instead.

## 2024-10-05 ENCOUNTER — Telehealth: Payer: Self-pay

## 2024-10-05 NOTE — Telephone Encounter (Signed)
 Attempted call to patient to inform him that referral was sent for cardiology as welll as a referral to Philip Shelton for patient assistance guidance regarding Eliquis  cost. Left a voicemail message requesting a return call.

## 2024-10-05 NOTE — Progress Notes (Signed)
 Care Guide Pharmacy Note  10/05/2024 Name: Philip Shelton MRN: [id number] DOB: September 08, 1961  Referred By: Alvan Dorothyann BIRCH, MD Reason for referral: Call Attempt #1 and Complex Care Management (Outreach to sch ref w/ pharm)   Philip Shelton is a 65 y.o. year old male who is a primary care patient of Alvan, Dorothyann BIRCH, MD.  Philip Shelton was referred to the pharmacist for assistance related to: DMII  An unsuccessful telephone outreach was attempted today to contact the patient who was referred to the pharmacy team for assistance with medication management. Additional attempts will be made to contact the patient.  Philip Shelton, Philip Shelton Guide Direct Dial: [phone]  Fax: [phone]

## 2024-10-06 ENCOUNTER — Telehealth: Payer: Self-pay

## 2024-10-06 NOTE — Progress Notes (Signed)
 Care Guide Pharmacy Note  10/06/2024 Name: General Wearing MRN: [id number] DOB: October 08, 1960  Referred By: Alvan Dorothyann BIRCH, MD Reason for referral: Call Attempt #2 (Outreach to sch ref w/ pharm) and Complex Care Management   Philip Shelton is a 64 y.o. year old male who is a primary care patient of Alvan, Dorothyann BIRCH, MD.  Jasraj Lappe was referred to the pharmacist for assistance related to: DMII  A second unsuccessful telephone outreach was attempted today to contact the patient who was referred to the pharmacy team for assistance with medication management. Additional attempts will be made to contact the patient.  Doyce Razor [facility], [facility] Guide Direct Dial: [phone]  Fax: [phone]

## 2024-10-07 NOTE — Progress Notes (Signed)
 Care Guide Pharmacy Note  10/07/2024 Name: Philip Shelton MRN: [id number] DOB: June 10, 1961  Referred By: Alvan Dorothyann BIRCH, MD Reason for referral: Call Attempt #2 (Outreach to sch ref w/ pharm), Complex Care Management, and Call Attempt #3   Philip Shelton is a 65 y.o. year old male who is a primary care patient of Alvan Dorothyann BIRCH, MD.  Philip Shelton was referred to the pharmacist for assistance related to: HTN  Successful contact was made with the patient to discuss pharmacy services including being ready for the pharmacist to call at least 5 minutes before the scheduled appointment time and to have medication bottles and any blood pressure readings ready for review. The patient agreed to meet with the pharmacist via telephone visit on 10/20/2024.  Philip Shelton [facility], [facility] Guide Direct Dial: [phone]  Fax: [phone]

## 2024-10-14 ENCOUNTER — Other Ambulatory Visit: Payer: Self-pay | Admitting: Family Medicine

## 2024-10-14 DIAGNOSIS — I10 Essential (primary) hypertension: Secondary | ICD-10-CM

## 2024-10-20 ENCOUNTER — Other Ambulatory Visit

## 2024-10-31 ENCOUNTER — Ambulatory Visit: Admitting: Family Medicine

## 2024-11-01 ENCOUNTER — Ambulatory Visit

## 2025-01-17 ENCOUNTER — Ambulatory Visit: Admitting: Family Medicine
